# Patient Record
Sex: Female | Born: 1985 | Race: White | Hispanic: No | Marital: Married | Smoking: Current every day smoker
Health system: Southern US, Community
[De-identification: ages and names within clinical notes are randomized; demographics above are authoritative.]

## PROBLEM LIST (undated history)

## (undated) ENCOUNTER — Inpatient Hospital Stay: Payer: Self-pay

## (undated) DIAGNOSIS — R32 Unspecified urinary incontinence: Secondary | ICD-10-CM

## (undated) DIAGNOSIS — F329 Major depressive disorder, single episode, unspecified: Secondary | ICD-10-CM

## (undated) DIAGNOSIS — F909 Attention-deficit hyperactivity disorder, unspecified type: Secondary | ICD-10-CM

## (undated) DIAGNOSIS — G709 Myoneural disorder, unspecified: Secondary | ICD-10-CM

## (undated) DIAGNOSIS — B019 Varicella without complication: Secondary | ICD-10-CM

## (undated) DIAGNOSIS — R519 Headache, unspecified: Secondary | ICD-10-CM

## (undated) DIAGNOSIS — F319 Bipolar disorder, unspecified: Secondary | ICD-10-CM

## (undated) DIAGNOSIS — R51 Headache: Secondary | ICD-10-CM

## (undated) DIAGNOSIS — G43909 Migraine, unspecified, not intractable, without status migrainosus: Secondary | ICD-10-CM

## (undated) DIAGNOSIS — F419 Anxiety disorder, unspecified: Secondary | ICD-10-CM

## (undated) DIAGNOSIS — F32A Depression, unspecified: Secondary | ICD-10-CM

## (undated) HISTORY — DX: Attention-deficit hyperactivity disorder, unspecified type: F90.9

## (undated) HISTORY — DX: Varicella without complication: B01.9

## (undated) HISTORY — DX: Unspecified urinary incontinence: R32

## (undated) HISTORY — PX: GANGLION CYST EXCISION: SHX1691

## (undated) HISTORY — DX: Anxiety disorder, unspecified: F41.9

## (undated) HISTORY — PX: DILATION AND CURETTAGE OF UTERUS: SHX78

## (undated) HISTORY — PX: OVARIAN CYST REMOVAL: SHX89

## (undated) HISTORY — DX: Migraine, unspecified, not intractable, without status migrainosus: G43.909

## (undated) HISTORY — DX: Bipolar disorder, unspecified: F31.9

## (undated) HISTORY — DX: Myoneural disorder, unspecified: G70.9

---

## 2004-09-16 ENCOUNTER — Emergency Department: Payer: Self-pay | Admitting: Emergency Medicine

## 2004-10-15 ENCOUNTER — Emergency Department: Payer: Self-pay | Admitting: Emergency Medicine

## 2004-10-16 ENCOUNTER — Emergency Department: Payer: Self-pay | Admitting: Emergency Medicine

## 2004-11-03 ENCOUNTER — Emergency Department: Payer: Self-pay | Admitting: Emergency Medicine

## 2004-11-04 ENCOUNTER — Other Ambulatory Visit: Payer: Self-pay

## 2004-11-05 ENCOUNTER — Emergency Department: Payer: Self-pay | Admitting: Emergency Medicine

## 2004-11-06 ENCOUNTER — Emergency Department: Payer: Self-pay | Admitting: Emergency Medicine

## 2005-01-20 ENCOUNTER — Emergency Department: Payer: Self-pay | Admitting: Emergency Medicine

## 2005-03-09 ENCOUNTER — Emergency Department: Payer: Self-pay | Admitting: Emergency Medicine

## 2005-03-10 ENCOUNTER — Ambulatory Visit: Payer: Self-pay | Admitting: Emergency Medicine

## 2005-06-10 ENCOUNTER — Emergency Department: Payer: Self-pay | Admitting: Emergency Medicine

## 2005-06-15 ENCOUNTER — Emergency Department: Payer: Self-pay | Admitting: General Practice

## 2005-06-16 ENCOUNTER — Ambulatory Visit: Payer: Self-pay | Admitting: General Practice

## 2005-07-16 ENCOUNTER — Emergency Department: Payer: Self-pay | Admitting: Unknown Physician Specialty

## 2005-08-23 ENCOUNTER — Emergency Department: Payer: Self-pay | Admitting: Emergency Medicine

## 2005-09-17 ENCOUNTER — Emergency Department: Payer: Self-pay | Admitting: Unknown Physician Specialty

## 2005-10-24 ENCOUNTER — Emergency Department: Payer: Self-pay | Admitting: Emergency Medicine

## 2009-10-09 ENCOUNTER — Emergency Department: Payer: Self-pay | Admitting: Emergency Medicine

## 2009-10-12 ENCOUNTER — Emergency Department: Payer: Self-pay | Admitting: Emergency Medicine

## 2009-10-23 ENCOUNTER — Emergency Department: Payer: Self-pay | Admitting: Emergency Medicine

## 2009-11-01 ENCOUNTER — Emergency Department: Payer: Self-pay | Admitting: Emergency Medicine

## 2012-03-19 ENCOUNTER — Encounter: Payer: Self-pay | Admitting: Physical Medicine & Rehabilitation

## 2012-03-27 ENCOUNTER — Ambulatory Visit: Payer: Medicaid Other | Admitting: Physical Medicine & Rehabilitation

## 2012-04-13 ENCOUNTER — Ambulatory Visit: Payer: Medicaid Other | Admitting: Physical Medicine & Rehabilitation

## 2012-04-30 ENCOUNTER — Ambulatory Visit (HOSPITAL_BASED_OUTPATIENT_CLINIC_OR_DEPARTMENT_OTHER): Payer: Medicaid Other | Admitting: Physical Medicine & Rehabilitation

## 2012-04-30 ENCOUNTER — Encounter: Payer: Medicaid Other | Attending: Physical Medicine & Rehabilitation

## 2012-04-30 ENCOUNTER — Encounter: Payer: Self-pay | Admitting: Physical Medicine & Rehabilitation

## 2012-04-30 VITALS — BP 139/76 | HR 84 | Resp 16 | Ht 64.0 in | Wt 202.0 lb

## 2012-04-30 DIAGNOSIS — M549 Dorsalgia, unspecified: Secondary | ICD-10-CM

## 2012-04-30 DIAGNOSIS — G8929 Other chronic pain: Secondary | ICD-10-CM | POA: Insufficient documentation

## 2012-04-30 DIAGNOSIS — F17200 Nicotine dependence, unspecified, uncomplicated: Secondary | ICD-10-CM | POA: Insufficient documentation

## 2012-04-30 DIAGNOSIS — S335XXA Sprain of ligaments of lumbar spine, initial encounter: Secondary | ICD-10-CM

## 2012-04-30 DIAGNOSIS — M545 Low back pain, unspecified: Secondary | ICD-10-CM | POA: Insufficient documentation

## 2012-04-30 DIAGNOSIS — S39012A Strain of muscle, fascia and tendon of lower back, initial encounter: Secondary | ICD-10-CM | POA: Insufficient documentation

## 2012-04-30 MED ORDER — TRAMADOL HCL 50 MG PO TABS: 100.0000 mg | ORAL_TABLET | Freq: Three times a day (TID) | ORAL | Status: AC | PRN

## 2012-04-30 NOTE — Progress Notes (Signed)
Subjective:    Patient ID: Diane James, female    DOB: 1985/12/31, 26 y.o.   MRN: [id number]  HPI  26 year old female who gives a history of left-sided low back pain starting in 2009. She woke up from a D&C procedure and has had the pain since that time. She states she's had x-rays and MRIs but she does not have these with her today. She has been seen by [facility] but I do not have any notes. She has been seen by a pain clinic in [place] and I have reviewed their notes however no reference to any imaging studies. She has tried initially on tramadol 50 mg 3 times a day but then was advanced to oxycodone 5 mg twice a day then 5 mg 3 times a day. She did report a 45% pain relief with the oxycodone. Her pain is currently 7/10 in reviewing her notes from [place] she had a 7/10 pain while on the oxycodone. She is reporting that she has gone through physical therapy but this "" made her worse Her pain is worse morning and nighttime but during the day it's relatively better. Walking and bending as well as more sporting activities tend to make her pain worse. Her pain is improved with heat and the medication listed above. She states that one tramadol has not been physically helpful for her pain.  Past history positive for anxiety disorder as well as bipolar disorder,, a urine drug screen at previous pain clinic was positive for [facility]  But the patient states she was retested 2 weeks later and it was negative  Vocational history last employed in 2010.  Primary M.D. Is Dr. Mindi Curling  Social history single lives with her boyfriend smokes 1 pack per day of cigarettes denies any drug or alcohol abuse. Pain Inventory Average Pain 7 Pain Right Now 9 My pain is constant, sharp and stabbing  In the last 24 hours, has pain interfered with the following? General activity 6 Relation with others 8 Enjoyment of life 7 What TIME of day is your pain at its worst? morning and night Sleep (in general)  Poor  Pain is worse with: walking, bending and some activites Pain improves with: heat/ice and medication Relief from Meds: 5  Mobility walk without assistance ability to climb steps?  yes do you drive?  yes  Function not employed: date last employed 2010  Neuro/Psych anxiety  Prior Studies new patient  Physicians involved in your care Any changes since last visit?  no new patient eval    Review of Systems  Musculoskeletal: Positive for back pain.  Psychiatric/Behavioral: The patient is nervous/anxious.   All other systems reviewed and are negative.       Objective:   Physical Exam  Constitutional: She is oriented to person, place, and time. She appears well-developed and well-nourished.  HENT:  Head: Normocephalic and atraumatic.  Eyes: Conjunctivae are normal.  Neck: Normal range of motion.  Musculoskeletal: Normal range of motion.       Right hip: Normal.       Left hip: Normal.       Lumbar back: She exhibits tenderness and pain. She exhibits normal range of motion, no bony tenderness, no swelling, no deformity and no spasm.       Back:  Neurological: She is alert and oriented to person, place, and time. She has normal strength and normal reflexes. No sensory deficit. Coordination normal.  Reflex Scores:      Tricep reflexes are 2+ on  the right side and 2+ on the left side.      Bicep reflexes are 2+ on the right side and 2+ on the left side.      Brachioradialis reflexes are 2+ on the right side and 2+ on the left side.      Patellar reflexes are 2+ on the right side and 2+ on the left side.      Achilles reflexes are 2+ on the right side and 2+ on the left side. Skin: Skin is warm and dry.  Psychiatric: She has a normal mood and affect.          Assessment & Plan:  Chronic low back painbased on history and examination appears to be a lumbar strain. Given the chronicity of the problem I think it's reasonable to check some x-rays as well as send her  through some physical therapy. Her description of prior physical therapy does not sound like lumbar stabilization program was performed. I think this is worthwhile. As I discussed with the patient I currently do not see any indication for narcotic analgesics. Furthermore she had a urine drug screen that was reported positive for marijuana although she denies smoking. Taking this as a fact, her opioid risk total score would bump to 8 putting her at high-risk for opiate misuse. I will increase her tramadol 100 mg every 8 hours. If the patient is still complaining of severe pain when I reevaluated her we may need to get an MRI of the lumbar spine.

## 2012-04-30 NOTE — Patient Instructions (Signed)
Back Pain, Adult Low back pain is very common. About 1 in 5 people have back pain.The cause of low back pain is rarely dangerous. The pain often gets better over time.About half of people with a sudden onset of back pain feel better in just 2 weeks. About 8 in 10 people feel better by 6 weeks.  CAUSES Some common causes of back pain include:  Strain of the muscles or ligaments supporting the spine.   Wear and tear (degeneration) of the spinal discs.   Arthritis.   Direct injury to the back.  DIAGNOSIS Most of the time, the direct cause of low back pain is not known.However, back pain can be treated effectively even when the exact cause of the pain is unknown.Answering your caregiver's questions about your overall health and symptoms is one of the most accurate ways to make sure the cause of your pain is not dangerous. If your caregiver needs more information, he or she may order lab work or imaging tests (X-rays or MRIs).However, even if imaging tests show changes in your back, this usually does not require surgery. HOME CARE INSTRUCTIONS For many people, back pain returns.Since low back pain is rarely dangerous, it is often a condition that people can learn to manageon their own.   Remain active. It is stressful on the back to sit or stand in one place. Do not sit, drive, or stand in one place for more than 30 minutes at a time. Take short walks on level surfaces as soon as pain allows.Try to increase the length of time you walk each day.   Do not stay in bed.Resting more than 1 or 2 days can delay your recovery.   Do not avoid exercise or work.Your body is made to move.It is not dangerous to be active, even though your back may hurt.Your back will likely heal faster if you return to being active before your pain is gone.   Pay attention to your body when you bend and lift. Many people have less discomfortwhen lifting if they bend their knees, keep the load close to their  bodies,and avoid twisting. Often, the most comfortable positions are those that put less stress on your recovering back.   Find a comfortable position to sleep. Use a firm mattress and lie on your side with your knees slightly bent. If you lie on your back, put a pillow under your knees.   Only take over-the-counter or prescription medicines as directed by your caregiver. Over-the-counter medicines to reduce pain and inflammation are often the most helpful.Your caregiver may prescribe muscle relaxant drugs.These medicines help dull your pain so you can more quickly return to your normal activities and healthy exercise.   Put ice on the injured area.   Put ice in a plastic bag.   Place a towel between your skin and the bag.   Leave the ice on for 15 to 20 minutes, 3 to 4 times a day for the first 2 to 3 days. After that, ice and heat may be alternated to reduce pain and spasms.   Ask your caregiver about trying back exercises and gentle massage. This may be of some benefit.   Avoid feeling anxious or stressed.Stress increases muscle tension and can worsen back pain.It is important to recognize when you are anxious or stressed and learn ways to manage it.Exercise is a great option.  SEEK MEDICAL CARE IF:  You have pain that is not relieved with rest or medicine.   You have   pain that does not improve in 1 week.   You have new symptoms.   You are generally not feeling well.  SEEK IMMEDIATE MEDICAL CARE IF:   You have pain that radiates from your back into your legs.   You develop new bowel or bladder control problems.   You have unusual weakness or numbness in your arms or legs.   You develop nausea or vomiting.   You develop abdominal pain.   You feel faint.  Document Released: 11/28/2005 Document Revised: 11/17/2011 Document Reviewed: 04/18/2011 Exit[employer] Patient Information 2012 [employer]. 

## 2012-05-01 NOTE — Progress Notes (Signed)
Addended by: Caryl Ada on: 05/01/2012 11:20 AM   Modules accepted: Orders

## 2012-05-15 ENCOUNTER — Ambulatory Visit: Payer: Medicaid Other | Attending: Physical Medicine & Rehabilitation | Admitting: Physical Therapy

## 2012-05-28 ENCOUNTER — Ambulatory Visit: Payer: Medicaid Other | Admitting: Physical Medicine & Rehabilitation

## 2012-05-28 ENCOUNTER — Encounter: Payer: Medicaid Other | Attending: Physical Medicine & Rehabilitation

## 2012-05-28 DIAGNOSIS — G8929 Other chronic pain: Secondary | ICD-10-CM | POA: Insufficient documentation

## 2012-05-28 DIAGNOSIS — F17200 Nicotine dependence, unspecified, uncomplicated: Secondary | ICD-10-CM | POA: Insufficient documentation

## 2012-05-28 DIAGNOSIS — M545 Low back pain, unspecified: Secondary | ICD-10-CM | POA: Insufficient documentation

## 2013-03-14 DIAGNOSIS — F329 Major depressive disorder, single episode, unspecified: Secondary | ICD-10-CM | POA: Insufficient documentation

## 2013-03-14 DIAGNOSIS — F419 Anxiety disorder, unspecified: Secondary | ICD-10-CM | POA: Insufficient documentation

## 2013-03-14 DIAGNOSIS — F319 Bipolar disorder, unspecified: Secondary | ICD-10-CM | POA: Insufficient documentation

## 2013-07-12 ENCOUNTER — Emergency Department: Payer: Self-pay | Admitting: Emergency Medicine

## 2013-07-12 LAB — CBC: HCT: 34.9 % — ABNORMAL LOW (ref 35.0–47.0)

## 2013-07-12 LAB — URINALYSIS, COMPLETE
Ketone: NEGATIVE
Leukocyte Esterase: NEGATIVE
Nitrite: NEGATIVE
Protein: NEGATIVE
RBC,UR: 1 /HPF (ref 0–5)

## 2013-07-12 LAB — COMPREHENSIVE METABOLIC PANEL
Albumin: 2.9 g/dL — ABNORMAL LOW (ref 3.4–5.0)
Alkaline Phosphatase: 84 U/L (ref 50–136)
BUN: 6 mg/dL — ABNORMAL LOW (ref 7–18)
Co2: 23 mmol/L (ref 21–32)
Creatinine: 0.53 mg/dL — ABNORMAL LOW (ref 0.60–1.30)
EGFR (African American): >60
SGOT(AST): 23 U/L (ref 15–37)
SGPT (ALT): 23 U/L (ref 12–78)
Sodium: 138 mmol/L (ref 136–145)
Total Protein: 6.8 g/dL (ref 6.4–8.2)

## 2013-07-12 LAB — LIPASE, BLOOD: Lipase: 109 U/L (ref 73–393)

## 2013-11-01 ENCOUNTER — Emergency Department: Payer: Self-pay | Admitting: Emergency Medicine

## 2013-11-02 LAB — URINALYSIS, COMPLETE
Ketone: NEGATIVE
Nitrite: NEGATIVE
Ph: 5 (ref 4.5–8.0)
Protein: NEGATIVE
RBC,UR: 1 /HPF (ref 0–5)
Specific Gravity: 1.021 (ref 1.003–1.030)

## 2013-11-02 LAB — HCG, QUANTITATIVE, PREGNANCY: Beta Hcg, Quant.: 8253 m[IU]/mL — ABNORMAL HIGH

## 2013-11-14 DIAGNOSIS — R768 Other specified abnormal immunological findings in serum: Secondary | ICD-10-CM | POA: Insufficient documentation

## 2014-09-02 DIAGNOSIS — O0990 Supervision of high risk pregnancy, unspecified, unspecified trimester: Secondary | ICD-10-CM | POA: Insufficient documentation

## 2014-10-24 ENCOUNTER — Emergency Department: Payer: Self-pay | Admitting: Emergency Medicine

## 2014-10-24 LAB — COMPREHENSIVE METABOLIC PANEL
ALBUMIN: 3.8 g/dL (ref 3.4–5.0)
ALK PHOS: 88 U/L
AST: 24 U/L (ref 15–37)
Anion Gap: 7 (ref 7–16)
BUN: 10 mg/dL (ref 7–18)
Bilirubin,Total: 0.3 mg/dL (ref 0.2–1.0)
CALCIUM: 8.6 mg/dL (ref 8.5–10.1)
Chloride: 108 mmol/L — ABNORMAL HIGH (ref 98–107)
Co2: 24 mmol/L (ref 21–32)
Creatinine: 0.61 mg/dL (ref 0.60–1.30)
EGFR (Non-African Amer.): >60
Glucose: 92 mg/dL (ref 65–99)
Osmolality: 276 (ref 275–301)
POTASSIUM: 4 mmol/L (ref 3.5–5.1)
SGPT (ALT): 21 U/L
SODIUM: 139 mmol/L (ref 136–145)
TOTAL PROTEIN: 7.5 g/dL (ref 6.4–8.2)

## 2014-10-24 LAB — HCG, QUANTITATIVE, PREGNANCY: Beta Hcg, Quant.: 3752 m[IU]/mL — ABNORMAL HIGH

## 2014-10-24 LAB — HEPATIC FUNCTION PANEL A (ARMC): Bilirubin, Direct: <0.1 mg/dL (ref 0.0–0.2)

## 2015-04-06 LAB — SURGICAL PATHOLOGY

## 2015-06-24 LAB — OB RESULTS CONSOLE ABO/RH: RH TYPE: POSITIVE

## 2015-06-24 LAB — OB RESULTS CONSOLE RPR: RPR: NONREACTIVE

## 2015-06-24 LAB — OB RESULTS CONSOLE ANTIBODY SCREEN: ANTIBODY SCREEN: NEGATIVE

## 2015-06-24 LAB — OB RESULTS CONSOLE HIV ANTIBODY (ROUTINE TESTING): HIV: NONREACTIVE

## 2015-07-09 DIAGNOSIS — O99210 Obesity complicating pregnancy, unspecified trimester: Secondary | ICD-10-CM | POA: Insufficient documentation

## 2015-07-09 LAB — OB RESULTS CONSOLE HIV ANTIBODY (ROUTINE TESTING): HIV: NONREACTIVE

## 2015-07-09 LAB — OB RESULTS CONSOLE ABO/RH: RH TYPE: POSITIVE

## 2015-07-09 LAB — OB RESULTS CONSOLE HEPATITIS B SURFACE ANTIGEN: HEP B S AG: NEGATIVE

## 2015-07-09 LAB — OB RESULTS CONSOLE RUBELLA ANTIBODY, IGM: RUBELLA: IMMUNE

## 2015-07-09 LAB — OB RESULTS CONSOLE ANTIBODY SCREEN: ANTIBODY SCREEN: NEGATIVE

## 2015-07-09 LAB — OB RESULTS CONSOLE RPR: RPR: NONREACTIVE

## 2015-08-14 ENCOUNTER — Emergency Department
Admission: EM | Admit: 2015-08-14 | Discharge: 2015-08-14 | Disposition: A | Discharge status: Home / Self Care | Payer: No Typology Code available for payment source | Attending: Emergency Medicine | Admitting: Emergency Medicine

## 2015-08-14 ENCOUNTER — Encounter: Payer: Self-pay | Admitting: Emergency Medicine

## 2015-08-14 DIAGNOSIS — J069 Acute upper respiratory infection, unspecified: Secondary | ICD-10-CM | POA: Diagnosis not present

## 2015-08-14 DIAGNOSIS — B9789 Other viral agents as the cause of diseases classified elsewhere: Secondary | ICD-10-CM

## 2015-08-14 DIAGNOSIS — Z3A16 16 weeks gestation of pregnancy: Secondary | ICD-10-CM | POA: Diagnosis not present

## 2015-08-14 DIAGNOSIS — R062 Wheezing: Secondary | ICD-10-CM

## 2015-08-14 DIAGNOSIS — O99512 Diseases of the respiratory system complicating pregnancy, second trimester: Secondary | ICD-10-CM | POA: Diagnosis present

## 2015-08-14 DIAGNOSIS — Z87891 Personal history of nicotine dependence: Secondary | ICD-10-CM | POA: Insufficient documentation

## 2015-08-14 DIAGNOSIS — J988 Other specified respiratory disorders: Secondary | ICD-10-CM

## 2015-08-14 MED ORDER — ALBUTEROL SULFATE HFA 108 (90 BASE) MCG/ACT IN AERS: 2.0000 | INHALATION_SPRAY | Freq: Four times a day (QID) | RESPIRATORY_TRACT | Status: DC | PRN

## 2015-08-14 NOTE — ED Provider Notes (Signed)
CSN: [id number]     Arrival date & time 08/14/15  1744 History   First MD Initiated Contact with Patient 08/14/15 1758     Chief Complaint  Patient presents with  . Cough     (Consider location/radiation/quality/duration/timing/severity/associated sxs/prior Treatment) HPI  29 year old female presents to the emergency department for evaluation of cough, wheezing. She is [redacted] weeks pregnant, being seen at [facility]. She denies any abdominal pain, vaginal bleeding, discharge. Patient states for the last 3 days she's had a cough or runny nose congestion and wheezing. She has been taking Robitussin with minimal relief. She denies any fevers. She has had some sharp burning substernal chest pain with repetitive coughing. She is tolerating by mouth.  Past Medical History  Diagnosis Date  . Bipolar disorder   . Neuromuscular disorder    Past Surgical History  Procedure Laterality Date  . Ganglion cyst excision      left wrist x3  . Ovarian cyst removal      right ovary  . Dilation and curettage of uterus     Family History  Problem Relation Age of Onset  . Fibromyalgia Mother    Social History  Substance Use Topics  . Smoking status: Former Smoker -- 1.00 packs/day for 7 years    Types: Cigarettes  . Smokeless tobacco: Never Used  . Alcohol Use: None   OB History    Gravida Para Term Preterm AB TAB SAB Ectopic Multiple Living   1              Review of Systems  Constitutional: Negative for fever, chills, activity change and fatigue.  HENT: Positive for congestion. Negative for sinus pressure and sore throat.   Eyes: Negative for visual disturbance.  Respiratory: Positive for cough and wheezing. Negative for chest tightness and shortness of breath.   Cardiovascular: Negative for chest pain and leg swelling.  Gastrointestinal: Negative for nausea, vomiting, abdominal pain and diarrhea.  Genitourinary: Negative for dysuria.  Musculoskeletal: Negative for arthralgias and gait problem.   Skin: Negative for rash.  Neurological: Negative for weakness, numbness and headaches.  Hematological: Negative for adenopathy.  Psychiatric/Behavioral: Negative for behavioral problems, confusion and agitation.      Allergies  Silvadene and Morphine and related  Home Medications   Prior to Admission medications   Medication Sig Start Date End Date Taking? Authorizing Provider  albuterol (PROVENTIL HFA;VENTOLIN HFA) 108 (90 BASE) MCG/ACT inhaler Inhale 2 puffs into the lungs every 6 (six) hours as needed for wheezing or shortness of breath. 08/14/15   Evon Slack, PA-C  clonazePAM (KLONOPIN) 1 MG tablet Take 1 mg by mouth 3 (three) times daily as needed.    Historical Provider, MD  lithium carbonate 300 MG capsule Take 300 mg by mouth 3 (three) times daily with meals. Takes 2 q am and 3 q pm (  total)    Historical Provider, MD   BP 117/76 mmHg  Pulse 83  Temp(Src) 98.3 F (36.8 C) (Oral)  Resp 18  Ht  (1.626 m)  Wt 240 lb (108.863 kg)  BMI 41.18 kg/m2  SpO2 99% Physical Exam  Constitutional: She is oriented to person, place, and time. She appears well-developed and well-nourished. No distress.  HENT:  Head: Normocephalic and atraumatic.  Mouth/Throat: Oropharynx is clear and moist.  Moist mucous membranes  Eyes: EOM are normal. Pupils are equal, round, and reactive to light. Right eye exhibits no discharge. Left eye exhibits no discharge.  Neck: Normal range of  motion. Neck supple.  Cardiovascular: Normal rate, regular rhythm and intact distal pulses.   Pulmonary/Chest: Effort normal. No respiratory distress. She has wheezes (mild expiratory). She has no rales. She exhibits no tenderness.  Abdominal: Soft. She exhibits no distension. There is no tenderness. There is no guarding.  Appears [redacted] wks pregnant with normal fundal height.  Musculoskeletal: Normal range of motion. She exhibits no edema.  Lymphadenopathy:    She has cervical adenopathy (posterior  cervical).  Neurological: She is alert and oriented to person, place, and time. She has normal reflexes.  Skin: Skin is warm and dry.  Psychiatric: She has a normal mood and affect. Her behavior is normal. Thought content normal.    ED Course  Procedures (including critical care time) Labs Review Labs Reviewed - No data to display  Imaging Review No results found. I have personally reviewed and evaluated these images and lab results as part of my medical decision-making.   EKG Interpretation None      MDM   Final diagnoses:  Wheezing  Viral respiratory illness    29 year old female with 3 days of viral respiratory illness including cough, congestion, runny nose. Patient's vital signs are stable. Fetal heart tones within normal limits. Patient was given a prescription for albuterol to help with wheezing. She will return to the ER for any fevers or worsening symptoms urgent changes her health. She'll make sure that she is increasing her fluid intake and seemed hydrated.    Evon Slack, PA-C 08/14/15 1832  Emily Filbert, MD 08/14/15 2138

## 2015-08-14 NOTE — Discharge Instructions (Signed)
Cough, Adult ° A cough is a reflex that helps clear your throat and airways. It can help heal the body or may be a reaction to an irritated airway. A cough may only last 2 or 3 weeks (acute) or may last more than 8 weeks (chronic).  °CAUSES °Acute cough: °· Viral or bacterial infections. °Chronic cough: °· Infections. °· Allergies. °· Asthma. °· Post-nasal drip. °· Smoking. °· Heartburn or acid reflux. °· Some medicines. °· Chronic lung problems (COPD). °· Cancer. °SYMPTOMS  °· Cough. °· Fever. °· Chest pain. °· Increased breathing rate. °· High-pitched whistling sound when breathing (wheezing). °· Colored mucus that you cough up (sputum). °TREATMENT  °· A bacterial cough may be treated with antibiotic medicine. °· A viral cough must run its course and will not respond to antibiotics. °· Your caregiver may recommend other treatments if you have a chronic cough. °HOME CARE INSTRUCTIONS  °· Only take over-the-counter or prescription medicines for pain, discomfort, or fever as directed by your caregiver. Use cough suppressants only as directed by your caregiver. °· Use a cold steam vaporizer or humidifier in your bedroom or home to help loosen secretions. °· Sleep in a semi-upright position if your cough is worse at night. °· Rest as needed. °· Stop smoking if you smoke. °SEEK IMMEDIATE MEDICAL CARE IF:  °· You have pus in your sputum. °· Your cough starts to worsen. °· You cannot control your cough with suppressants and are losing sleep. °· You begin coughing up blood. °· You have difficulty breathing. °· You develop pain which is getting worse or is uncontrolled with medicine. °· You have a fever. °MAKE SURE YOU:  °· Understand these instructions. °· Will watch your condition. °· Will get help right away if you are not doing well or get worse. °Document Released: 05/27/2011 Document Revised: 02/20/2012 Document Reviewed: 05/27/2011 °ExitCare® Patient Information ©2015 [employer]. This information is not intended  to replace advice given to you by your health care provider. Make sure you discuss any questions you have with your health care provider. °Viral Infections °A viral infection can be caused by different types of viruses. Most viral infections are not serious and resolve on their own. However, some infections may cause severe symptoms and may lead to further complications. °SYMPTOMS °Viruses can frequently cause: °· Minor sore throat. °· Aches and pains. °· Headaches. °· Runny nose. °· Different types of rashes. °· Watery eyes. °· Tiredness. °· Cough. °· Loss of appetite. °· Gastrointestinal infections, resulting in nausea, vomiting, and diarrhea. °These symptoms do not respond to antibiotics because the infection is not caused by bacteria. However, you might catch a bacterial infection following the viral infection. This is sometimes called a "superinfection." Symptoms of such a bacterial infection may include: °· Worsening sore throat with pus and difficulty swallowing. °· Swollen neck glands. °· Chills and a high or persistent fever. °· Severe headache. °· Tenderness over the sinuses. °· Persistent overall ill feeling (malaise), muscle aches, and tiredness (fatigue). °· Persistent cough. °· Yellow, green, or brown mucus production with coughing. °HOME CARE INSTRUCTIONS  °· Only take over-the-counter or prescription medicines for pain, discomfort, diarrhea, or fever as directed by your caregiver. °· Drink enough water and fluids to keep your urine clear or pale yellow. Sports drinks can provide valuable electrolytes, sugars, and hydration. °· Get plenty of rest and maintain proper nutrition. Soups and broths with crackers or rice are fine. °SEEK IMMEDIATE MEDICAL CARE IF:  °· You have severe headaches,   shortness of breath, chest pain, neck pain, or an unusual rash. °· You have uncontrolled vomiting, diarrhea, or you are unable to keep down fluids. °· You or your child has an oral temperature above 102° F (38.9° C),  not controlled by medicine. °· Your baby is older than 3 months with a rectal temperature of 102° F (38.9° C) or higher. °· Your baby is 3 months old or younger with a rectal temperature of 100.4° F (38° C) or higher. °MAKE SURE YOU:  °· Understand these instructions. °· Will watch your condition. °· Will get help right away if you are not doing well or get worse. °Document Released: 09/07/2005 Document Revised: 02/20/2012 Document Reviewed: 04/04/2011 °ExitCare® Patient Information ©2015 [employer]. This information is not intended to replace advice given to you by your health care provider. Make sure you discuss any questions you have with your health care provider. ° °

## 2015-08-14 NOTE — ED Notes (Signed)
Fetal heart tones 142 mid left abd   Mternal HR   82

## 2015-08-14 NOTE — ED Notes (Signed)
Pt presents with cough for three days. Pt states cough was productive at first but now just feels "tight in her chest". Pt states has also had nasal congestion. Pt reports having a low-grade fever at home. Pt reports SOB. Pt is [redacted] weeks pregnant.

## 2015-08-14 NOTE — ED Notes (Signed)
Pt states she has been coughing since Wed, has a baby with bronchitis, states she is 16 weeks preg, appears in no distress.

## 2015-09-08 ENCOUNTER — Emergency Department: Payer: No Typology Code available for payment source

## 2015-09-08 ENCOUNTER — Encounter: Payer: Self-pay | Admitting: Emergency Medicine

## 2015-09-08 ENCOUNTER — Emergency Department
Admission: EM | Admit: 2015-09-08 | Discharge: 2015-09-08 | Disposition: A | Discharge status: Home / Self Care | Payer: No Typology Code available for payment source | Attending: Emergency Medicine | Admitting: Emergency Medicine

## 2015-09-08 DIAGNOSIS — Y990 Civilian activity done for income or pay: Secondary | ICD-10-CM | POA: Diagnosis not present

## 2015-09-08 DIAGNOSIS — W010XXA Fall on same level from slipping, tripping and stumbling without subsequent striking against object, initial encounter: Secondary | ICD-10-CM | POA: Diagnosis not present

## 2015-09-08 DIAGNOSIS — S59901A Unspecified injury of right elbow, initial encounter: Secondary | ICD-10-CM | POA: Insufficient documentation

## 2015-09-08 DIAGNOSIS — S3992XA Unspecified injury of lower back, initial encounter: Secondary | ICD-10-CM | POA: Diagnosis not present

## 2015-09-08 DIAGNOSIS — O9A212 Injury, poisoning and certain other consequences of external causes complicating pregnancy, second trimester: Secondary | ICD-10-CM | POA: Diagnosis present

## 2015-09-08 DIAGNOSIS — Z3A20 20 weeks gestation of pregnancy: Secondary | ICD-10-CM | POA: Diagnosis not present

## 2015-09-08 DIAGNOSIS — Y9289 Other specified places as the place of occurrence of the external cause: Secondary | ICD-10-CM | POA: Diagnosis not present

## 2015-09-08 DIAGNOSIS — Z87891 Personal history of nicotine dependence: Secondary | ICD-10-CM | POA: Insufficient documentation

## 2015-09-08 DIAGNOSIS — Y9389 Activity, other specified: Secondary | ICD-10-CM | POA: Insufficient documentation

## 2015-09-08 DIAGNOSIS — S79911A Unspecified injury of right hip, initial encounter: Secondary | ICD-10-CM | POA: Insufficient documentation

## 2015-09-08 DIAGNOSIS — Z3492 Encounter for supervision of normal pregnancy, unspecified, second trimester: Secondary | ICD-10-CM

## 2015-09-08 LAB — URINALYSIS COMPLETE WITH MICROSCOPIC (ARMC ONLY)
BILIRUBIN URINE: NEGATIVE
Bacteria, UA: NONE SEEN
GLUCOSE, UA: NEGATIVE mg/dL
Hgb urine dipstick: NEGATIVE
Leukocytes, UA: NEGATIVE
Nitrite: NEGATIVE
Protein, ur: NEGATIVE mg/dL
Specific Gravity, Urine: 1.013 (ref 1.005–1.030)
pH: 6 (ref 5.0–8.0)

## 2015-09-08 LAB — CBC WITH DIFFERENTIAL/PLATELET
Basophils Absolute: 0.1 10*3/uL (ref 0–0.1)
Basophils Relative: 1 %
EOS PCT: 1 %
Eosinophils Absolute: 0.1 10*3/uL (ref 0–0.7)
HCT: 35.3 % (ref 35.0–47.0)
Hemoglobin: 12.6 g/dL (ref 12.0–16.0)
LYMPHS ABS: 2.5 10*3/uL (ref 1.0–3.6)
LYMPHS PCT: 23 %
MCH: 33.8 pg (ref 26.0–34.0)
MCHC: 35.8 g/dL (ref 32.0–36.0)
MCV: 94.4 fL (ref 80.0–100.0)
MONOS PCT: 4 %
Monocytes Absolute: 0.5 10*3/uL (ref 0.2–0.9)
Neutro Abs: 7.6 10*3/uL — ABNORMAL HIGH (ref 1.4–6.5)
Neutrophils Relative %: 71 %
PLATELETS: 245 10*3/uL (ref 150–440)
RBC: 3.74 MIL/uL — AB (ref 3.80–5.20)
RDW: 13.5 % (ref 11.5–14.5)
WBC: 10.7 10*3/uL (ref 3.6–11.0)

## 2015-09-08 LAB — COMPREHENSIVE METABOLIC PANEL
ALK PHOS: 59 U/L (ref 38–126)
ALT: 11 U/L — AB (ref 14–54)
AST: 17 U/L (ref 15–41)
Albumin: 3.4 g/dL — ABNORMAL LOW (ref 3.5–5.0)
Anion gap: 6 (ref 5–15)
BUN: 7 mg/dL (ref 6–20)
CALCIUM: 8.9 mg/dL (ref 8.9–10.3)
CHLORIDE: 106 mmol/L (ref 101–111)
CO2: 22 mmol/L (ref 22–32)
CREATININE: 0.45 mg/dL (ref 0.44–1.00)
Glucose, Bld: 80 mg/dL (ref 65–99)
Potassium: 3.6 mmol/L (ref 3.5–5.1)
Sodium: 134 mmol/L — ABNORMAL LOW (ref 135–145)
Total Bilirubin: 0.5 mg/dL (ref 0.3–1.2)
Total Protein: 6.7 g/dL (ref 6.5–8.1)

## 2015-09-08 LAB — HCG, QUANTITATIVE, PREGNANCY: HCG, BETA CHAIN, QUANT, S: 8600 m[IU]/mL — AB (ref <5)

## 2015-09-08 MED ORDER — ACETAMINOPHEN 325 MG PO TABS
ORAL_TABLET | ORAL | Status: AC
  Administered 2015-09-08: 650 mg via ORAL
  Filled 2015-09-08: qty 2

## 2015-09-08 MED ORDER — CYCLOBENZAPRINE HCL 10 MG PO TABS: 10.0000 mg | ORAL_TABLET | Freq: Three times a day (TID) | ORAL | Status: DC | PRN

## 2015-09-08 MED ORDER — ACETAMINOPHEN 325 MG PO TABS
650.0000 mg | ORAL_TABLET | Freq: Once | ORAL | Status: AC
  Administered 2015-09-08: 650 mg via ORAL

## 2015-09-08 NOTE — ED Notes (Signed)
[id number] female presents to ED with c/o fall while at work approximately 1.5 hrs PTA. Pt reports that she was walking where someone had just mopped and fell down onto her right side. Pt reports pain to right elbow and right hip. Pt reports that she has been able to move and ambulate since falling. Pt states, "I don't think I hit my belly but I can't say for sure that I didn't." Pt is awake and alert during triage.

## 2015-09-08 NOTE — Discharge Instructions (Signed)
Second Trimester of Pregnancy The second trimester is from week 13 through week 28, months 4 through 6. The second trimester is often a time when you feel your best. Your body has also adjusted to being pregnant, and you begin to feel better physically. Usually, morning sickness has lessened or quit completely, you may have more energy, and you may have an increase in appetite. The second trimester is also a time when the fetus is growing rapidly. At the end of the sixth month, the fetus is about 9 inches long and weighs about 1 pounds. You will likely begin to feel the baby move (quickening) between 18 and 20 weeks of the pregnancy. BODY CHANGES Your body goes through many changes during pregnancy. The changes vary from woman to woman.   Your weight will continue to increase. You will notice your lower abdomen bulging out.  You may begin to get stretch marks on your hips, abdomen, and breasts.  You may develop headaches that can be relieved by medicines approved by your health care provider.  You may urinate more often because the fetus is pressing on your bladder.  You may develop or continue to have heartburn as a result of your pregnancy.  You may develop constipation because certain hormones are causing the muscles that push waste through your intestines to slow down.  You may develop hemorrhoids or swollen, bulging veins (varicose veins).  You may have back pain because of the weight gain and pregnancy hormones relaxing your joints between the bones in your pelvis and as a result of a shift in weight and the muscles that support your balance.  Your breasts will continue to grow and be tender.  Your gums may bleed and may be sensitive to brushing and flossing.  Dark spots or blotches (chloasma, mask of pregnancy) may develop on your face. This will likely fade after the baby is born.  A dark line from your belly button to the pubic area (linea nigra) may appear. This will likely fade  after the baby is born.  You may have changes in your hair. These can include thickening of your hair, rapid growth, and changes in texture. Some women also have hair loss during or after pregnancy, or hair that feels dry or thin. Your hair will most likely return to normal after your baby is born. WHAT TO EXPECT AT YOUR PRENATAL VISITS During a routine prenatal visit:  You will be weighed to make sure you and the fetus are growing normally.  Your blood pressure will be taken.  Your abdomen will be measured to track your baby's growth.  The fetal heartbeat will be listened to.  Any test results from the previous visit will be discussed. Your health care provider may ask you:  How you are feeling.  If you are feeling the baby move.  If you have had any abnormal symptoms, such as leaking fluid, bleeding, severe headaches, or abdominal cramping.  If you have any questions. Other tests that may be performed during your second trimester include:  Blood tests that check for:  Low iron levels (anemia).  Gestational diabetes (between 24 and 28 weeks).  Rh antibodies.  Urine tests to check for infections, diabetes, or protein in the urine.  An ultrasound to confirm the proper growth and development of the baby.  An amniocentesis to check for possible genetic problems.  Fetal screens for spina bifida and Down syndrome. HOME CARE INSTRUCTIONS   Avoid all smoking, herbs, alcohol, and unprescribed   drugs. These chemicals affect the formation and growth of the baby.  Follow your health care provider's instructions regarding medicine use. There are medicines that are either safe or unsafe to take during pregnancy.  Exercise only as directed by your health care provider. Experiencing uterine cramps is a good sign to stop exercising.  Continue to eat regular, healthy meals.  Wear a good support bra for breast tenderness.  Do not use hot tubs, steam rooms, or saunas.  Wear your  seat belt at all times when driving.  Avoid raw meat, uncooked cheese, cat litter boxes, and soil used by cats. These carry germs that can cause birth defects in the baby.  Take your prenatal vitamins.  Try taking a stool softener (if your health care provider approves) if you develop constipation. Eat more high-fiber foods, such as fresh vegetables or fruit and whole grains. Drink plenty of fluids to keep your urine clear or pale yellow.  Take warm sitz baths to soothe any pain or discomfort caused by hemorrhoids. Use hemorrhoid cream if your health care provider approves.  If you develop varicose veins, wear support hose. Elevate your feet for 15 minutes, 3-4 times a day. Limit salt in your diet.  Avoid heavy lifting, wear low heel shoes, and practice good posture.  Rest with your legs elevated if you have leg cramps or low back pain.  Visit your dentist if you have not gone yet during your pregnancy. Use a soft toothbrush to brush your teeth and be gentle when you floss.  A sexual relationship may be continued unless your health care provider directs you otherwise.  Continue to go to all your prenatal visits as directed by your health care provider. SEEK MEDICAL CARE IF:   You have dizziness.  You have mild pelvic cramps, pelvic pressure, or nagging pain in the abdominal area.  You have persistent nausea, vomiting, or diarrhea.  You have a bad smelling vaginal discharge.  You have pain with urination. SEEK IMMEDIATE MEDICAL CARE IF:   You have a fever.  You are leaking fluid from your vagina.  You have spotting or bleeding from your vagina.  You have severe abdominal cramping or pain.  You have rapid weight gain or loss.  You have shortness of breath with chest pain.  You notice sudden or extreme swelling of your face, hands, ankles, feet, or legs.  You have not felt your baby move in over an hour.  You have severe headaches that do not go away with  medicine.  You have vision changes. Document Released: 11/22/2001 Document Revised: 12/03/2013 Document Reviewed: 01/29/2013 Exit[employer] Patient Information 2015 [employer]. This information is not intended to replace advice given to you by your health care provider. Make sure you discuss any questions you have with your health care provider.  

## 2015-09-08 NOTE — ED Notes (Signed)
Pt reports slipping and falling at work today. Pt denies LOC, hitting her head or neck pain. Pt reports right sided pain, right elbow pain and right hip pain. Pt ambulates without assistance.

## 2015-09-08 NOTE — ED Provider Notes (Signed)
[facility] Emergency Department Provider Note     Time seen: ----------------------------------------- 1:03 PM on 09/08/2015 -----------------------------------------    I have reviewed the triage vital signs and the nursing notes.   HISTORY  Chief Complaint Fall    HPI Diane James is a 29 y.o. female who presents ER for slipping and falling at work today. Patient states she landed on her right side, she is currently [redacted] weeks pregnant. Complaining of right side pain and right elbow pain and right hip pain. She stable and really without assistance. Fetal heart tones here in triage were normal. Not had any vaginal bleeding or leakage of fluid.   Past Medical History  Diagnosis Date  . Bipolar disorder   . Neuromuscular disorder     Patient Active Problem List   Diagnosis Date Noted  . Lumbago 04/30/2012  . Lumbar strain 04/30/2012    Past Surgical History  Procedure Laterality Date  . Ganglion cyst excision      left wrist x3  . Ovarian cyst removal      right ovary  . Dilation and curettage of uterus      Allergies Silvadene and Morphine and related  Social History Social History  Substance Use Topics  . Smoking status: Former Smoker -- 1.00 packs/day for 7 years    Types: Cigarettes  . Smokeless tobacco: Never Used  . Alcohol Use: No    Review of Systems Constitutional: Negative for fever. Eyes: Negative for visual changes. ENT: Negative for sore throat. Cardiovascular: Negative for chest pain. Respiratory: Negative for shortness of breath. Gastrointestinal: Negative for abdominal pain, vomiting and diarrhea. Genitourinary: Negative for dysuria. Musculoskeletal: Negative for back pain. Skin: Negative for rash. Neurological: Negative for headaches, focal weakness or numbness.  10-point ROS otherwise negative.  ____________________________________________   PHYSICAL EXAM:  VITAL SIGNS: ED Triage Vitals  Enc  Vitals Group     BP 09/08/15 1133 122/73 mmHg     Pulse Rate 09/08/15 1133 93     Resp 09/08/15 1133 18     Temp 09/08/15 1133 99.5 F (37.5 C)     Temp Source 09/08/15 1133 Oral     SpO2 09/08/15 1133 97 %     Weight 09/08/15 1133 245 lb (111.131 kg)     Height 09/08/15 1133  (1.626 m)     Head Cir --      Peak Flow --      Pain Score 09/08/15 1134 9     Pain Loc --      Pain Edu? --      Excl. in GC? --     Constitutional: Alert and oriented. Well appearing and in no distress. Eyes: Conjunctivae are normal. PERRL. Normal extraocular movements. ENT   Head: Normocephalic and atraumatic.   Nose: No congestion/rhinnorhea.   Mouth/Throat: Mucous membranes are moist.   Neck: No stridor. Cardiovascular: Normal rate, regular rhythm. Normal and symmetric distal pulses are present in all extremities. No murmurs, rubs, or gallops. Respiratory: Normal respiratory effort without tachypnea nor retractions. Breath sounds are clear and equal bilaterally. No wheezes/rales/rhonchi. Gastrointestinal: Soft and nontender. No distention. No abdominal bruits.  Musculoskeletal: Mild pain with range of motion of the low back and right hip. Neurologic:  Normal speech and language. No gross focal neurologic deficits are appreciated. Speech is normal. No gait instability. Skin:  Skin is warm, dry and intact. No rash noted. Psychiatric: Mood and affect are normal. Speech and behavior are normal. Patient exhibits  appropriate insight and judgment. ____________________________________________  ED COURSE:  Pertinent labs & imaging results that were available during my care of the patient were reviewed by me and considered in my medical decision making (see chart for details). We'll obtain labs and ultrasound. ____________________________________________    LABS (pertinent positives/negatives)  Labs Reviewed  CBC WITH DIFFERENTIAL/PLATELET - Abnormal; Notable for the following:    RBC  3.74 (*)    Neutro Abs 7.6 (*)    All other components within normal limits  COMPREHENSIVE METABOLIC PANEL - Abnormal; Notable for the following:    Sodium 134 (*)    Albumin 3.4 (*)    ALT 11 (*)    All other components within normal limits  URINALYSIS COMPLETEWITH MICROSCOPIC ([facility] ONLY) - Abnormal; Notable for the following:    Color, Urine YELLOW (*)    APPearance HAZY (*)    Ketones, ur 1+ (*)    Squamous Epithelial / LPF 0-5 (*)    All other components within normal limits  HCG, QUANTITATIVE, PREGNANCY  TYPE AND SCREEN    RADIOLOGY   Ultrasound OB limited IMPRESSION: 1. Single live intrauterine pregnancy with best estimated gestational age of [redacted] weeks 1 day. This corresponds with the expected gestational age based on LMP. 2. No acute fetal, placental or adnexal abnormalities identified.  This exam is performed on an emergent basis and does not comprehensively evaluate fetal size, dating, or anatomy; follow-up complete OB US should be considered if further fetal assessment is warranted.  ____________________________________________  FINAL ASSESSMENT AND PLAN  Fall, [redacted] weeks pregnant  Plan: Patient with labs and imaging as dictated above. Labs and ultrasound are unremarkable this time. Previous to date has her at just short of 20 weeks. She will have close follow-up with [facility] for reevaluation.   Emily Filbert, MD   Emily Filbert, MD 09/08/15 [id number]

## 2015-09-08 NOTE — ED Notes (Signed)
Fetal heart tones assessed. Fetal HR 150 to LLQ with pt HR 84.

## 2015-09-17 DIAGNOSIS — O99330 Smoking (tobacco) complicating pregnancy, unspecified trimester: Secondary | ICD-10-CM | POA: Insufficient documentation

## 2015-12-07 ENCOUNTER — Inpatient Hospital Stay
Admission: EM | Admit: 2015-12-07 | Discharge: 2015-12-08 | Disposition: A | Discharge status: Home / Self Care | Payer: Medicaid Other | Attending: Obstetrics and Gynecology | Admitting: Obstetrics and Gynecology

## 2015-12-07 DIAGNOSIS — O26893 Other specified pregnancy related conditions, third trimester: Secondary | ICD-10-CM | POA: Diagnosis not present

## 2015-12-07 DIAGNOSIS — O368130 Decreased fetal movements, third trimester, not applicable or unspecified: Secondary | ICD-10-CM | POA: Diagnosis present

## 2015-12-07 DIAGNOSIS — R51 Headache: Secondary | ICD-10-CM | POA: Diagnosis present

## 2015-12-07 DIAGNOSIS — Z3A35 35 weeks gestation of pregnancy: Secondary | ICD-10-CM | POA: Diagnosis not present

## 2015-12-07 HISTORY — DX: Headache: R51

## 2015-12-07 HISTORY — DX: Headache, unspecified: R51.9

## 2015-12-07 HISTORY — DX: Depression, unspecified: F32.A

## 2015-12-07 HISTORY — DX: Major depressive disorder, single episode, unspecified: F32.9

## 2015-12-07 LAB — URINALYSIS COMPLETE WITH MICROSCOPIC (ARMC ONLY)
BILIRUBIN URINE: NEGATIVE
GLUCOSE, UA: NEGATIVE mg/dL
Hgb urine dipstick: NEGATIVE
Ketones, ur: NEGATIVE mg/dL
Leukocytes, UA: NEGATIVE
Nitrite: NEGATIVE
Protein, ur: NEGATIVE mg/dL
Specific Gravity, Urine: 1.004 — ABNORMAL LOW (ref 1.005–1.030)
pH: 7 (ref 5.0–8.0)

## 2015-12-07 LAB — CBC WITH DIFFERENTIAL/PLATELET
BASOS PCT: 0 %
Basophils Absolute: 0 10*3/uL (ref 0–0.1)
EOS ABS: 0.1 10*3/uL (ref 0–0.7)
Eosinophils Relative: 1 %
HCT: 34.7 % — ABNORMAL LOW (ref 35.0–47.0)
HEMOGLOBIN: 12.1 g/dL (ref 12.0–16.0)
Lymphocytes Relative: 26 %
Lymphs Abs: 2.8 10*3/uL (ref 1.0–3.6)
MCH: 33 pg (ref 26.0–34.0)
MCHC: 34.8 g/dL (ref 32.0–36.0)
MCV: 94.8 fL (ref 80.0–100.0)
MONOS PCT: 6 %
Monocytes Absolute: 0.6 10*3/uL (ref 0.2–0.9)
NEUTROS PCT: 67 %
Neutro Abs: 7 10*3/uL — ABNORMAL HIGH (ref 1.4–6.5)
Platelets: 207 10*3/uL (ref 150–440)
RBC: 3.66 MIL/uL — AB (ref 3.80–5.20)
RDW: 13.3 % (ref 11.5–14.5)
WBC: 10.4 10*3/uL (ref 3.6–11.0)

## 2015-12-07 LAB — COMPREHENSIVE METABOLIC PANEL
ALBUMIN: 3.2 g/dL — AB (ref 3.5–5.0)
ALK PHOS: 69 U/L (ref 38–126)
ALT: 11 U/L — ABNORMAL LOW (ref 14–54)
ANION GAP: 7 (ref 5–15)
AST: 18 U/L (ref 15–41)
BUN: 7 mg/dL (ref 6–20)
CALCIUM: 9 mg/dL (ref 8.9–10.3)
CO2: 23 mmol/L (ref 22–32)
Chloride: 107 mmol/L (ref 101–111)
Creatinine, Ser: 0.36 mg/dL — ABNORMAL LOW (ref 0.44–1.00)
GFR calc Af Amer: >60 mL/min (ref >60)
GFR calc non Af Amer: >60 mL/min (ref >60)
GLUCOSE: 85 mg/dL (ref 65–99)
Potassium: 3.7 mmol/L (ref 3.5–5.1)
SODIUM: 137 mmol/L (ref 135–145)
Total Bilirubin: 0.5 mg/dL (ref 0.3–1.2)
Total Protein: 6.4 g/dL — ABNORMAL LOW (ref 6.5–8.1)

## 2015-12-07 LAB — LACTATE DEHYDROGENASE: LDH: 119 U/L (ref 98–192)

## 2015-12-07 LAB — URIC ACID: Uric Acid, Serum: 3.4 mg/dL (ref 2.3–6.6)

## 2015-12-07 MED ORDER — BUTALBITAL-APAP-CAFFEINE 50-325-40 MG PO TABS
1.0000 | ORAL_TABLET | Freq: Once | ORAL | Status: AC
  Administered 2015-12-07: 1 via ORAL
  Filled 2015-12-07: qty 1

## 2015-12-07 NOTE — OB Triage Provider Note (Signed)
History     CSN: [id number]  Arrival date and time: 12/07/15 2119   None     Chief Complaint  Patient presents with  . Hypertension  . Decreased Fetal Movement  . Dizziness   Hypertension Associated symptoms include chest pain and headaches. Pertinent negatives include no blurred vision, palpitations or shortness of breath.  Dizziness Associated symptoms include chest pain and headaches. Pertinent negatives include no abdominal pain, chills, fever, nausea, rash or vomiting.  Diane James is a [id number] at 32+5 weeks today by LMP of 04/23/15 with an EDD of 01/28/16 presenting today with multiple complaints: elevated blood pressure checked by her home monitor with readings of 164/101, decreased fetal movement, she also c/o of increased swelling in her hands and face, dizziness with standing, migraine headaches without relief from Tylenol, and numbness and tingling in her hands bilaterally.  She reports she has a history of migraine headaches, anxiety and depression in which she does not take medication, and a fetal demise at 34 weeks for abruption.  She sees [facility] for her prenatal care.  She is currently only taking an ASA and prenatal vitamin.   OB History    Gravida Para Term Preterm AB TAB SAB Ectopic Multiple Living   6 3 2 1 2  2   2       Obstetric Comments   3rd pregnancy was stillborn      Past Medical History  Diagnosis Date  . Bipolar disorder ([facility])   . Neuromuscular disorder ([facility])   . Headache   . Depression     Past Surgical History  Procedure Laterality Date  . Ganglion cyst excision      left wrist x3  . Ovarian cyst removal      right ovary  . Dilation and curettage of uterus      Family History  Problem Relation Age of Onset  . Fibromyalgia Mother     Social History  Substance Use Topics  . Smoking status: Former Smoker -- 1.00 packs/day for 7 years    Types: Cigarettes  . Smokeless tobacco: Never Used  . Alcohol Use: No    Allergies:   Allergies  Allergen Reactions  . Silvadene [Silver Sulfadiazine] Hives  . Morphine And Related     Prescriptions prior to admission  Medication Sig Dispense Refill Last Dose  . aspirin 81 MG tablet Take 81 mg by mouth daily.     . cyclobenzaprine (FLEXERIL) 10 MG tablet Take 1 tablet (10 mg total) by mouth every 8 (eight) hours as needed for muscle spasms. 30 tablet 1   . Prenatal Vit-Fe Fumarate-FA (PRENATAL MULTIVITAMIN) TABS tablet Take 1 tablet by mouth daily at 12 noon.       Review of Systems  Constitutional: Negative for fever and chills.  HENT:       Migraine headache without relief of Tylenol  Eyes: Negative for blurred vision, double vision and photophobia.  Respiratory: Negative for shortness of breath and wheezing.   Cardiovascular: Positive for chest pain. Negative for palpitations.  Gastrointestinal: Negative for heartburn, nausea, vomiting and abdominal pain.  Genitourinary: Negative for dysuria.  Musculoskeletal:       +numbness and tingling in hands bilaterally   Skin: Negative for itching and rash.  Neurological: Positive for dizziness and headaches.  Psychiatric/Behavioral: The patient is nervous/anxious.   Good fetal movement now Denies LOF, VB, abnormal discharge  Physical Exam   Blood pressure 122/73, pulse 81, temperature 98.9 F (37.2 C), temperature source  Oral, resp. rate 18, height 5' 4"  (1.626 m), weight 115.214 kg (254 lb), last menstrual period 04/13/2015.  Physical Exam  Constitutional: She is oriented to person, place, and time. She appears well-developed and well-nourished.  HENT:  Head: Normocephalic.  Eyes: Pupils are equal, round, and reactive to light.  Neck: Normal range of motion.  Cardiovascular: Normal rate and regular rhythm.   Respiratory: Effort normal and breath sounds normal.  GI: Soft. Bowel sounds are normal.  obese  Genitourinary:  +gravid uterus, no contractions palpated, non-tender  Musculoskeletal:  Slight pitting  edema in LE feet bilaterally   Neurological: She is alert and oriented to person, place, and time. She displays normal reflexes.  Skin: Skin is warm and dry.   Fetal Monitoring: Baseline: 125 bpm / moderate variability /+accels / no decels TOCO: uterus is quiet  Results for Diane, James (MRN [id number]) as of 12/08/2015 00:13  Ref. Range 12/07/2015 22:51  Sodium Latest Ref Range: 135-145 mmol/L 137  Potassium Latest Ref Range: 3.5-5.1 mmol/L 3.7  Chloride Latest Ref Range: 101-111 mmol/L 107  CO2 Latest Ref Range: 22-32 mmol/L 23  BUN Latest Ref Range: 6-20 mg/dL 7  Creatinine Latest Ref Range: 0.44-1.00 mg/dL 0.36 (L)  Calcium Latest Ref Range: 8.9-10.3 mg/dL 9.0  EGFR (Non-African Amer.) Latest Ref Range: >60 mL/min >60  EGFR (African American) Latest Ref Range: >60 mL/min >60  Glucose Latest Ref Range: 65-99 mg/dL 85  Anion gap Latest Ref Range: 5-15  7  Alkaline Phosphatase Latest Ref Range: 38-126 U/L 69  Albumin Latest Ref Range: 3.5-5.0 g/dL 3.2 (L)  Uric Acid, Serum Latest Ref Range: 2.3-6.6 mg/dL 3.4  AST Latest Ref Range: 15-41 U/L 18  ALT Latest Ref Range: 14-54 U/L 11 (L)  Total Protein Latest Ref Range: 6.5-8.1 g/dL 6.4 (L)  Total Bilirubin Latest Ref Range: 0.3-1.2 mg/dL 0.5  LDH Latest Ref Range: 98-192 U/L 119  WBC Latest Ref Range: 3.6-11.0 K/uL 10.4  RBC Latest Ref Range: 3.80-5.20 MIL/uL 3.66 (L)  Hemoglobin Latest Ref Range: 12.0-16.0 g/dL 12.1  HCT Latest Ref Range: 35.0-47.0 % 34.7 (L)  MCV Latest Ref Range: 80.0-100.0 fL 94.8  MCH Latest Ref Range: 26.0-34.0 pg 33.0  MCHC Latest Ref Range: 32.0-36.0 g/dL 34.8  RDW Latest Ref Range: 11.5-14.5 % 13.3  Platelets Latest Ref Range: 150-440 K/uL 207  Neutrophils Latest Units: % 67  Lymphocytes Latest Units: % 26  Monocytes Relative Latest Units: % 6  Eosinophil Latest Units: % 1  Basophil Latest Units: % 0  NEUT# Latest Ref Range: 1.4-6.5 K/uL 7.0 (H)  Lymphocyte # Latest Ref Range: 1.0-3.6 K/uL  2.8  Monocyte # Latest Ref Range: 0.2-0.9 K/uL 0.6  Eosinophils Absolute Latest Ref Range: 0-0.7 K/uL 0.1  Basophils Absolute Latest Ref Range: 0-0.1 K/uL 0.0   Results for Diane, James (MRN [id number]) as of 12/08/2015 00:13  Ref. Range 12/07/2015 22:42  Appearance Latest Ref Range: CLEAR  CLEAR (A)  Bacteria, UA Latest Ref Range: NONE SEEN  RARE (A)  Bilirubin Urine Latest Ref Range: NEGATIVE  NEGATIVE  Color, Urine Latest Ref Range: YELLOW  STRAW (A)  Glucose Latest Ref Range: NEGATIVE mg/dL NEGATIVE  Hgb urine dipstick Latest Ref Range: NEGATIVE  NEGATIVE  Hyaline Casts, UA Unknown PRESENT  Ketones, ur Latest Ref Range: NEGATIVE mg/dL NEGATIVE  Leukocytes, UA Latest Ref Range: NEGATIVE  NEGATIVE  Nitrite Latest Ref Range: NEGATIVE  NEGATIVE  pH Latest Ref Range: 5.0-8.0  7.0  Protein Latest Ref Range: NEGATIVE mg/dL  NEGATIVE  RBC / HPF Latest Ref Range: 0-5 RBC/hpf 0-5  Specific Gravity, Urine Latest Ref Range: 1.005-1.030  1.004 (L)  Squamous Epithelial / LPF Latest Ref Range: NONE SEEN  0-5 (A)  WBC, UA Latest Ref Range: 0-5 WBC/hpf 0-5  Total Protein, Urine Latest Units: mg/dL <6  Protein Creatinine Ratio Latest Ref Range: 0.00-0.15 mg/mgCre Pend  RESULT BELOW REPORTABLE RANGE,  UNABLE TO CALCULATE.  Creatinine, Urine Latest Units: mg/dL 25     Procedures    Assessment and Plan  IUP at 34 weeks Category 1 fetal tracing Muddy labs drawn Serial blood pressures Fioricet x 1 for headache   Darliss Cheney 12/07/2015, 11:36 PM   Update at 0030: Headache is completely gone with 1 Fioricet - pt. Reports she does not feel tired and actually has "a boost of energy" and feels safe to drive 30 minutes home - offered patient to stay at hospital and rest and she declines and states she will call L&D when she gets home safely PIH labs WNL BP's stable and WNL Category 1 fetal tracing Carpal Tunnel - recommend having her OB order wrist/hand splints  FKC's  daily Preterm labor precautions and warning s/s reviewed  D/C home  F/U at [facility] this morning  12/27 at [facility], CNM

## 2015-12-07 NOTE — OB Triage Note (Signed)
C/o Headache, dizziness, decreased fetal movement, increased BP, hands and lips numbness

## 2015-12-07 NOTE — Progress Notes (Signed)
Pt states she has taken Tylenol 2 tabs around 1800.

## 2015-12-08 LAB — PROTEIN / CREATININE RATIO, URINE: Creatinine, Urine: 25 mg/dL

## 2015-12-08 NOTE — Progress Notes (Signed)
Pt states she has relief of headache following the medication.  States she is able to drive.  She was also given d/c instructions and verbalized understanding.  She then went ambulatory home in stable condition.

## 2016-03-23 ENCOUNTER — Ambulatory Visit: Payer: Self-pay | Admitting: Family Medicine

## 2016-03-30 ENCOUNTER — Ambulatory Visit (INDEPENDENT_AMBULATORY_CARE_PROVIDER_SITE_OTHER): Payer: BLUE CROSS/BLUE SHIELD | Admitting: Family Medicine

## 2016-03-30 ENCOUNTER — Encounter: Payer: Self-pay | Admitting: Family Medicine

## 2016-03-30 ENCOUNTER — Other Ambulatory Visit: Payer: Self-pay

## 2016-03-30 VITALS — BP 128/78 | HR 80 | Temp 98.1°F | Resp 16 | Ht 64.0 in | Wt 245.0 lb

## 2016-03-30 DIAGNOSIS — F319 Bipolar disorder, unspecified: Secondary | ICD-10-CM | POA: Diagnosis not present

## 2016-03-30 DIAGNOSIS — F909 Attention-deficit hyperactivity disorder, unspecified type: Secondary | ICD-10-CM

## 2016-03-30 DIAGNOSIS — F988 Other specified behavioral and emotional disorders with onset usually occurring in childhood and adolescence: Secondary | ICD-10-CM | POA: Insufficient documentation

## 2016-03-30 DIAGNOSIS — J309 Allergic rhinitis, unspecified: Secondary | ICD-10-CM | POA: Insufficient documentation

## 2016-03-30 DIAGNOSIS — R32 Unspecified urinary incontinence: Secondary | ICD-10-CM | POA: Diagnosis not present

## 2016-03-30 MED ORDER — DESMOPRESSIN ACETATE 0.2 MG PO TABS: 600.0000 ug | ORAL_TABLET | Freq: Every day | ORAL | Status: DC

## 2016-03-30 MED ORDER — BUPROPION HCL ER (XL) 300 MG PO TB24: 300.0000 mg | ORAL_TABLET | Freq: Every day | ORAL | Status: DC

## 2016-03-30 MED ORDER — ARIPIPRAZOLE 15 MG PO TABS: 15.0000 mg | ORAL_TABLET | Freq: Every day | ORAL | Status: DC

## 2016-03-30 NOTE — Progress Notes (Signed)
Date:  03/30/2016   Name:  Diane James   DOB:  May 08, 1986   MRN:  [id number]  PCP:  [facility]    Chief Complaint: Establish Care   History of Present Illness:  This is a 30 y.o. female to establish care. Nine wks postpartum (pregnancy uncomplicated), also has 31 y/o son with febrile seizures and 139 y/o son with ADHD. Has IUD now and not breastfeeding. Hx bipolar 1 d/o, hospitalized age 30 and [age], on Depakote, lithium, Risperdal, Prozac, Zoloft in past without benefit and several made gain weight. Abilify helped but couldn't afford at time. Last saw psych during last psych hospitalization. On Wellbutrin before pregnancy, stopped when found out pregnant, restarted 2d ago, wonders if longer acting form. On Klonopin in past for anxiety, still has some but not taking currently, makes feel blah and interferes with childcare. On DDAVP for enuresis for 15 yrs, urology w/u negative. Father with bipolar d/o, mother with fibromyalgia. Imms UTD, no longer smoking.  Review of Systems:  Review of Systems  Constitutional: Negative for fever and fatigue.  Respiratory: Negative for cough and shortness of breath.   Cardiovascular: Negative for chest pain and leg swelling.  Endocrine: Negative for polyuria.  Genitourinary: Negative for difficulty urinating.  Neurological: Negative for syncope and light-headedness.    Patient Active Problem List   Diagnosis Date Noted  . Obesity, Class III, BMI 40-49.9 (morbid obesity) (HCC) 03/30/2016  . Enuresis 03/30/2016  . Allergic rhinitis 03/30/2016  . ADD (attention deficit disorder) 03/30/2016  . Obesity affecting pregnancy, antepartum 07/09/2015  . High-risk pregnancy 09/02/2014  . Other specified abnormal immunological findings in serum 11/14/2013  . Anxiety and depression 03/14/2013  . Bipolar I disorder (HCC) 03/14/2013  . Lumbar strain 04/30/2012    Prior to Admission medications   Medication Sig Start Date End Date Taking?  Authorizing Provider  acetaminophen (TYLENOL) 325 MG tablet Take by mouth.   Yes Historical Provider, MD  desmopressin (DDAVP) 0.2 MG tablet Take 3 tablets (600 mcg total) by mouth at bedtime. 03/30/16  Yes Schuyler AmorWilliam Mekai Wilkinson, MD  loratadine (SM LORATADINE) 5 MG/5ML syrup Take by mouth.   Yes Historical Provider, MD  ARIPiprazole (ABILIFY) 15 MG tablet Take 1 tablet (15 mg total) by mouth daily. 03/30/16   Schuyler AmorWilliam Deontay Ladnier, MD  buPROPion (WELLBUTRIN XL) 300 MG 24 hr tablet Take 1 tablet (300 mg total) by mouth daily. 03/30/16   Schuyler AmorWilliam Ardys Hataway, MD  clonazePAM Scarlette Calico(KLONOPIN) 0.5 MG tablet Reported on 03/30/2016 01/27/16   Historical Provider, MD    Allergies  Allergen Reactions  . Morphine Hives  . Silvadene [Silver Sulfadiazine] Hives    burning  . Morphine And Related     Past Surgical History  Procedure Laterality Date  . Ganglion cyst excision      left wrist x3  . Ovarian cyst removal      right ovary  . Dilation and curettage of uterus      Social History  Substance Use Topics  . Smoking status: Former Smoker -- 1.00 packs/day for 7 years    Types: Cigarettes  . Smokeless tobacco: Never Used  . Alcohol Use: No    Family History  Problem Relation Age of Onset  . Fibromyalgia Mother   . Cancer Maternal Grandmother     breast cancer    Medication list has been reviewed and updated.  Physical Examination: BP 128/78 mmHg  Pulse 80  Temp(Src) 98.1 F (36.7 C) (Oral)  Resp 16  Ht  (1.626 m)  Wt 245 lb (111.131 kg)  BMI 42.03 kg/m2  LMP 03/30/2016  Breastfeeding? Unknown  Physical Exam  Constitutional: She is oriented to person, place, and time. She appears well-developed and well-nourished.  HENT:  Right Ear: External ear normal.  Left Ear: External ear normal.  Nose: Nose normal.  Mouth/Throat: Oropharynx is clear and moist.  TM's clear  Eyes: Conjunctivae and EOM are normal. Pupils are equal, round, and reactive to light.  Neck: Neck supple. No thyromegaly present.   Cardiovascular: Normal rate, regular rhythm and normal heart sounds.   Pulmonary/Chest: Effort normal and breath sounds normal.  Abdominal: Soft. She exhibits no distension. There is no tenderness.  Musculoskeletal: She exhibits no edema.  Lymphadenopathy:    She has no cervical adenopathy.  Neurological: She is alert and oriented to person, place, and time.  Skin: Skin is warm and dry.  Psychiatric: She has a normal mood and affect. Her behavior is normal.  Nursing note and vitals reviewed.   Assessment and Plan:  1. Bipolar I disorder (HCC) Change Wellbutrin to XR 300 mg daily and restart Abilify 15 mg daily, may use Klonopin prn anxiety - Ambulatory referral to Psychiatry  2. ADD (attention deficit disorder) No rx at present, psych to follow  3. Enuresis Refill DDAVP, urology w/u negative in past  4. Allergic rhinitis, unspecified allergic rhinitis type Well controlled on prn loratidine  5. Obesity, Class III, BMI 40-49.9 (morbid obesity) (HCC) Relates to psych meds, difficult to exercise with childcare responsibilities  Return in about 4 weeks (around 04/27/2016).  Dionne Ano. Kingsley Spittle MD [facility]  03/30/2016

## 2016-04-11 ENCOUNTER — Ambulatory Visit: Payer: BLUE CROSS/BLUE SHIELD | Admitting: Licensed Clinical Social Worker

## 2016-04-15 ENCOUNTER — Encounter: Payer: Self-pay | Admitting: Licensed Clinical Social Worker

## 2016-04-15 ENCOUNTER — Ambulatory Visit (INDEPENDENT_AMBULATORY_CARE_PROVIDER_SITE_OTHER): Payer: BLUE CROSS/BLUE SHIELD | Admitting: Licensed Clinical Social Worker

## 2016-04-15 DIAGNOSIS — F3160 Bipolar disorder, current episode mixed, unspecified: Secondary | ICD-10-CM
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
## 2016-04-21 ENCOUNTER — Telehealth: Payer: Self-pay | Admitting: Psychiatry

## 2016-04-21 ENCOUNTER — Encounter: Payer: Self-pay | Admitting: Psychiatry

## 2016-04-21 ENCOUNTER — Ambulatory Visit (INDEPENDENT_AMBULATORY_CARE_PROVIDER_SITE_OTHER): Payer: BLUE CROSS/BLUE SHIELD | Admitting: Psychiatry

## 2016-04-21 VITALS — BP 122/80 | HR 77 | Temp 97.7°F | Wt 250.2 lb

## 2016-04-21 DIAGNOSIS — F3160 Bipolar disorder, current episode mixed, unspecified: Secondary | ICD-10-CM | POA: Diagnosis not present

## 2016-04-21 MED ORDER — LURASIDONE HCL 20 MG PO TABS: 20.0000 mg | ORAL_TABLET | Freq: Every morning | ORAL | Status: DC

## 2016-04-21 NOTE — Progress Notes (Signed)
Psychiatric Initial Adult Assessment   Patient Identification: Diane James MRN:  [id number] Date of Evaluation:  04/21/2016 Referral Source: Self referred Chief Complaint:   Chief Complaint    Establish Care; Anxiety; Depression; Stress; Fatigue     Visit Diagnosis:    ICD-9-CM ICD-10-CM   1. Bipolar I disorder, most recent episode mixed (HCC) 296.60 F31.60     History of Present Illness:  Patient is a 30 year old Caucasian female who presented today for initial evaluation along with 72-year-old toddler and 60-month-old baby. Patient has been seen by a therapist here and diagnosed with bipolar disorder, mixed episode, ADHD, depression and anxiety. Patient states that she does not really believe she has bipolar disorder and states how she is been having problems with her mental health since age 64. States that she's been on several medications and they have never helped her. She reported that she has been diagnosed as bipolar and has been on medications for that and other than gaining weight she feels they have not helped with her mood. Patient had to be redirected several times as to her current concerns that are bringing her to a psychiatrist. She states that for many years she was on medications through her primary care physician and then realize she needed to see a psychiatrist. Currently she reports that she has taken herself off the Abilify and Wellbutrin 2 weeks ago. States that she feels they were not helping her. She denies any mood symptoms of depression currently. States that she has not been sleeping and has a lot of energy, feels irritable a lot but she is taking good care of her children. States that she is unable to settle down and get any task done. The session was interrupted constantly by her toddler child needing patient's attention. Patient started to get irritable in between. She continued to say that she was not depressed but her current issue was not sleeping. When we  discussed starting a medication to help her sleep and assess her in a few weeks patient stated she does not want to sleep through the night because she needed to be awake in case her toddler son had seizures or her baby had other needs. She states that the only reason she gets depressed is about her weight gain. Patient denied any suicidal thoughts. She does report feeling anxious a lot about her toddler getting another seizure since he already has febrile seizures. She is also very concerned about her 9-year-old son who has ADHD and is currently on a field trip. She states that she is always worried about something bad happening to one of her children. States that she had several miscarriages in the past few years and this seems to amplify today anxiety. Patient is not  keen on starting an antidepressant medication to address her anxiety. Given the level of irritability expressed by patient and her difficulty sleeping along with the distractibility discussed starting her on a mood stabilizer such as Latuda. Patient was initially agreeable and became upset that she did not need to be on a mood stabilizer.  Patient reports that she does not have very much support in this area. States that her husband is supportive but does not understand mental illness. However when she asks him to do something in particular he does help her out. States that her mother does not understand her mental health concerns and was instrumental in not providing her any care since she was a child. States that she does talk to her maternal  aunt who has depression and who is supportive to her.  Associated Signs/Symptoms: Depression Symptoms:  insomnia, psychomotor agitation, anxiety, (Hypo) Manic Symptoms:  Distractibility, Elevated Mood, Flight of Ideas, Irritable Mood, Anxiety Symptoms:  Excessive Worry, Panic Symptoms, Psychotic Symptoms:  denies PTSD Symptoms: Had a traumatic exposure:  Had a miscarriage in september of  2014  Past Psychiatric History: Diagnosed with Bipolar disorder, has been on medications through her primary care physician.  Previous Psychotropic Medications: Yes   Substance Abuse History in the last 12 months:  No.  Consequences of Substance Abuse: Negative  Past Medical History:  Past Medical History  Diagnosis Date  . Bipolar disorder (HCC)   . Neuromuscular disorder (HCC)   . Headache   . Depression   . Anxiety   . Chicken pox   . Migraines   . Enuresis   . ADHD (attention deficit hyperactivity disorder)     Past Surgical History  Procedure Laterality Date  . Ganglion cyst excision      left wrist x3  . Ovarian cyst removal      right ovary  . Dilation and curettage of uterus      Family Psychiatric History: Father has Depression and Anxiety. Maternal aunt has depression and had a suicide attempt.   Family History:  Family History  Problem Relation Age of Onset  . Fibromyalgia Mother   . Cancer Maternal Grandmother     breast cancer  . Anxiety disorder Father   . Depression Father   . ADD / ADHD Brother   . Bipolar disorder Paternal Uncle   . ADD / ADHD Brother     Social History:   Social History   Social History  . Marital Status: Married    Spouse Name: N/A  . Number of Children: N/A  . Years of Education: N/A   Social History Main Topics  . Smoking status: Current Every Day Smoker -- 1.00 packs/day for 7 years    Types: Cigarettes  . Smokeless tobacco: Never Used  . Alcohol Use: No  . Drug Use: No  . Sexual Activity: Yes    Birth Control/ Protection: IUD   Other Topics Concern  . None   Social History Narrative    Additional Social History: Patient is married and had 2 previous marriages. She has 3 children ages 939, 2 and 3 months. 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 Once he started developing seizures she quit her job and stays home with her  children.  Allergies:   Allergies  Allergen Reactions  . Morphine Hives  . Silvadene [Silver Sulfadiazine] Hives    burning  . Morphine And Related     Metabolic Disorder Labs: No results found for: HGBA1C, MPG No results found for: PROLACTIN No results found for: CHOL, TRIG, HDL, CHOLHDL, VLDL, LDLCALC   Current Medications: Current Outpatient Prescriptions  Medication Sig Dispense Refill  . acetaminophen (TYLENOL) 325 MG tablet Take by mouth.    . ARIPiprazole (ABILIFY) 15 MG tablet Take 1 tablet (15 mg total) by mouth daily. 30 tablet 2  . buPROPion (WELLBUTRIN XL) 300 MG 24 hr tablet Take 1 tablet (300 mg total) by mouth daily. 30 tablet 2  . clonazePAM (KLONOPIN) 0.5 MG tablet Reported on 03/30/2016  0  . desmopressin (DDAVP) 0.2 MG tablet Take 3 tablets (600 mcg total) by mouth at bedtime. 90 tablet 2  . loratadine (SM LORATADINE)  5 MG/5ML syrup Take by mouth.     No current facility-administered medications for this visit.    Neurologic: Headache: No Seizure: No Paresthesias:No  Musculoskeletal: Strength & Muscle Tone: within normal limits Gait & Station: normal Patient leans: N/A  Psychiatric Specialty Exam: ROS  Blood pressure 122/80, pulse 77, temperature 97.7 F (36.5 C), temperature source Tympanic, weight 250 lb 3.2 oz (113.49 kg), last menstrual period 03/30/2016, SpO2 97 %, unknown if currently breastfeeding.Body mass index is 42.93 kg/(m^2).  General Appearance: Casual  Eye Contact:  Good  Speech:  Clear and Coherent  Volume:  Normal  Mood:  Anxious and Depressed  Affect:  Congruent  Thought Process:  Coherent  Orientation:  Full (Time, Place, and Person)  Thought Content:  WDL  Suicidal Thoughts:  No  Homicidal Thoughts:  No  Memory:  Immediate;   Fair Recent;   Fair Remote;   Fair  Judgement:  Fair  Insight:  Fair  Psychomotor Activity:  Normal  Concentration:  Fair  Recall:  [employer] of Knowledge:Fair  Language: Fair  Akathisia:  No   Handed:  Right  AIMS (if indicated):    Assets:  Communication Skills Desire for Improvement Financial Resources/Insurance Housing Social Support  ADL's:  Intact  Cognition: WNL  Sleep:  poor    Treatment Plan Summary: Medication management   Bipolar disorder mixed Discussed starting Latuda at 20 mg once daily  to address her irritability, insomnia and anxiety. Patient appeared to be agreeable initially and then became very irritable. She told this clinician that she did not feel that she was heard and that she was being put on a mood stabilizer though she did not need one. When asked as to what did she think she needed she stated that she did not know. She was given [facility] information so she can obtain an evaluation for ADD/ADHD. She became more irritable and stated that she is already been through multiple evaluations by this clinician and the therapist. Patient was educated about [facility] and how it can help with various symptoms and the side effects discussed as well. Patient was asked to come back in 2 weeks and at that point she stated that she was not going to come back. She requested the front desk staff to change the M.D.  Insomnia We also discussed starting her on trazodone at 50 mg to help with her insomnia. Patient however stated that she is not interested in taking a sleep medication since she wants to be awake to be able to watch over her children.  Patient was to return to clinic in 2 weeks time but since she is not.wanting to see this clinician she will be referred to another M.D. or given a list of providers to pursue care.   Patrick North, MD 5/11/201711:04 AM

## 2016-04-26 ENCOUNTER — Telehealth: Payer: Self-pay

## 2016-04-26 NOTE — Telephone Encounter (Signed)
DID PRIOR AUTH ON [email or web address] - PRIOR AUTH SUBMITTED PENDING APPROVAL.

## 2016-04-26 NOTE — Telephone Encounter (Signed)
RECEIVED A FAX REQUESTING A PRIOR AUTH TO BE DONE FOR MEDICATION  LATUDA 20MG  TAKE 1 TABLET BY MOUTH EVERY MORNING #30.

## 2016-04-26 NOTE — Telephone Encounter (Signed)
PRIOR AUTH WAS FAXED TO PHARMACY

## 2016-04-26 NOTE — Telephone Encounter (Signed)
I DID ANOTHER REQUEST, STILL HAVE NOT HEARD ANYTHING SO I DID ANOTHER REQUEST.

## 2016-04-26 NOTE — Telephone Encounter (Signed)
RECEIVED FAX FROM [employer] THAT LATUDA WAS APPROVED.  EFFECTIVE DATE 04-22-16 TO  12-11-2038.  REFERENCE # [id number]

## 2016-04-29 ENCOUNTER — Ambulatory Visit (INDEPENDENT_AMBULATORY_CARE_PROVIDER_SITE_OTHER): Payer: BLUE CROSS/BLUE SHIELD | Admitting: Family Medicine

## 2016-04-29 ENCOUNTER — Encounter: Payer: Self-pay | Admitting: Family Medicine

## 2016-04-29 VITALS — BP 183/87 | HR 94 | Resp 16 | Ht 64.0 in | Wt 248.0 lb

## 2016-04-29 DIAGNOSIS — F909 Attention-deficit hyperactivity disorder, unspecified type: Secondary | ICD-10-CM

## 2016-04-29 DIAGNOSIS — F988 Other specified behavioral and emotional disorders with onset usually occurring in childhood and adolescence: Secondary | ICD-10-CM

## 2016-04-29 DIAGNOSIS — F418 Other specified anxiety disorders: Secondary | ICD-10-CM | POA: Diagnosis not present

## 2016-04-29 DIAGNOSIS — F329 Major depressive disorder, single episode, unspecified: Secondary | ICD-10-CM

## 2016-04-29 DIAGNOSIS — F32A Depression, unspecified: Secondary | ICD-10-CM

## 2016-04-29 DIAGNOSIS — F319 Bipolar disorder, unspecified: Secondary | ICD-10-CM | POA: Diagnosis not present

## 2016-04-29 DIAGNOSIS — F419 Anxiety disorder, unspecified: Principal | ICD-10-CM

## 2016-04-29 MED ORDER — BUPROPION HCL ER (XL) 300 MG PO TB24: 300.0000 mg | ORAL_TABLET | Freq: Every day | ORAL | Status: DC

## 2016-05-02 NOTE — Progress Notes (Signed)
Date:  04/29/2016   Name:  Diane James   DOB:  [birth year]   MRN:  [id number]  PCP:  [facility]    Chief Complaint: Manic Behavior   History of Present Illness:  This is a 30 y.o. female seen in one month f/u. Has been seen at [facility] since last visit, saw therapist then psychiatrist, told to stop Wellbutrin and Abilify and start Latuda but too expensive so never filled rx. PT not convinced she has bipolar d/o. Also wanted rx for ADHD, referred for neuropsych testing, has appt in [place] at end of month. Has two young kids with no one to watch.   Review of Systems:  Review of Systems  Constitutional: Negative for fever and fatigue.  Respiratory: Negative for cough and shortness of breath.   Cardiovascular: Negative for chest pain and leg swelling.  Endocrine: Negative for polyuria.  Genitourinary: Negative for difficulty urinating.  Neurological: Negative for syncope and light-headedness.    Patient Active Problem List   Diagnosis Date Noted  . Obesity, Class III, BMI 40-49.9 (morbid obesity) (HCC) 03/30/2016  . Enuresis 03/30/2016  . Allergic rhinitis 03/30/2016  . ADD (attention deficit disorder) 03/30/2016  . Obesity affecting pregnancy, antepartum 07/09/2015  . High-risk pregnancy 09/02/2014  . Other specified abnormal immunological findings in serum 11/14/2013  . Anxiety and depression 03/14/2013  . Bipolar I disorder (HCC) 03/14/2013  . Lumbar strain 04/30/2012    Prior to Admission medications   Medication Sig Start Date End Date Taking? Authorizing Provider  acetaminophen (TYLENOL) 325 MG tablet Take by mouth.   Yes Historical Provider, MD  buPROPion (WELLBUTRIN XL) 300 MG 24 hr tablet Take 1 tablet (300 mg total) by mouth daily. 04/29/16  Yes Schuyler Amor, MD  clonazePAM Scarlette Calico) 0.5 MG tablet Reported on 04/21/2016 01/27/16  Yes Historical Provider, MD  desmopressin (DDAVP) 0.2 MG tablet Take 3 tablets (600 mcg total) by mouth at bedtime.  03/30/16  Yes Schuyler Amor, MD    Allergies  Allergen Reactions  . Morphine Hives  . Silvadene [Silver Sulfadiazine] Hives    burning  . Morphine And Related     Past Surgical History  Procedure Laterality Date  . Ganglion cyst excision      left wrist x3  . Ovarian cyst removal      right ovary  . Dilation and curettage of uterus      Social History  Substance Use Topics  . Smoking status: Current Every Day Smoker -- 1.00 packs/day for 7 years    Types: Cigarettes  . Smokeless tobacco: Never Used  . Alcohol Use: No    Family History  Problem Relation Age of Onset  . Fibromyalgia Mother   . Cancer Maternal Grandmother     breast cancer  . Anxiety disorder Father   . Depression Father   . ADD / ADHD Brother   . Bipolar disorder Paternal Uncle   . ADD / ADHD Brother     Medication list has been reviewed and updated.  Physical Examination: BP 183/87 mmHg  Pulse 94  Resp 16  Ht  (1.626 m)  Wt 248 lb (112.492 kg)  BMI 42.55 kg/m2  LMP 03/30/2016  Physical Exam  Constitutional: She appears well-developed and well-nourished.  Cardiovascular: Normal rate, regular rhythm and normal heart sounds.   Pulmonary/Chest: Effort normal and breath sounds normal.  Musculoskeletal: She exhibits no edema.  Neurological: She is alert.  Skin: Skin is warm and dry.  Psychiatric: She  has a normal mood and affect. Her behavior is normal.  Nursing note and vitals reviewed.   Assessment and Plan:  1. Anxiety and depression Worse off Wellbutrin/Abilify, recommend restart Wellbutrin (has at home) until psych f/u  2. Bipolar I disorder (HCC) Unable to afford Latuda, to discuss at psych f/u  3. Obesity, Class III, BMI 40-49.9 (morbid obesity) (HCC) Weight up 3#, weight loss discussed  4. ADD (attention deficit disorder) Possible, for neuropsych testing later this month  Return in about 4 weeks (around 05/27/2016).  Dionne AnoWilliam M. Kingsley SpittlePlonk, Jr. MD [facility]  05/02/2016

## 2016-05-05 ENCOUNTER — Ambulatory Visit: Payer: No Typology Code available for payment source | Admitting: Psychiatry

## 2016-05-13 ENCOUNTER — Ambulatory Visit: Payer: BLUE CROSS/BLUE SHIELD | Admitting: Licensed Clinical Social Worker

## 2016-07-15 ENCOUNTER — Other Ambulatory Visit: Payer: Self-pay

## 2016-07-15 MED ORDER — DESMOPRESSIN ACETATE 0.2 MG PO TABS: 600.0000 ug | ORAL_TABLET | Freq: Every day | ORAL | 2 refills | Status: DC

## 2016-08-25 ENCOUNTER — Ambulatory Visit
Admission: EM | Admit: 2016-08-25 | Discharge: 2016-08-25 | Disposition: A | Discharge status: Home / Self Care | Payer: BLUE CROSS/BLUE SHIELD | Attending: Internal Medicine | Admitting: Internal Medicine

## 2016-08-25 ENCOUNTER — Encounter: Payer: Self-pay | Admitting: Emergency Medicine

## 2016-08-25 DIAGNOSIS — M25562 Pain in left knee: Secondary | ICD-10-CM

## 2016-08-25 DIAGNOSIS — H1033 Unspecified acute conjunctivitis, bilateral: Secondary | ICD-10-CM | POA: Diagnosis not present

## 2016-08-25 LAB — COMPREHENSIVE METABOLIC PANEL
ALBUMIN: 4.2 g/dL (ref 3.5–5.0)
ALT: 20 U/L (ref 14–54)
ANION GAP: 7 (ref 5–15)
AST: 21 U/L (ref 15–41)
Alkaline Phosphatase: 84 U/L (ref 38–126)
BUN: 13 mg/dL (ref 6–20)
CALCIUM: 9.2 mg/dL (ref 8.9–10.3)
CHLORIDE: 107 mmol/L (ref 101–111)
CO2: 23 mmol/L (ref 22–32)
Creatinine, Ser: 0.56 mg/dL (ref 0.44–1.00)
GFR calc Af Amer: >60 mL/min (ref >60)
GFR calc non Af Amer: >60 mL/min (ref >60)
GLUCOSE: 95 mg/dL (ref 65–99)
POTASSIUM: 4.2 mmol/L (ref 3.5–5.1)
SODIUM: 137 mmol/L (ref 135–145)
TOTAL PROTEIN: 7.9 g/dL (ref 6.5–8.1)
Total Bilirubin: 0.5 mg/dL (ref 0.3–1.2)

## 2016-08-25 LAB — CBC WITH DIFFERENTIAL/PLATELET
BASOS ABS: 0 10*3/uL (ref 0–0.1)
BASOS PCT: 0 %
EOS ABS: 0.2 10*3/uL (ref 0–0.7)
Eosinophils Relative: 2 %
HEMATOCRIT: 44.7 % (ref 35.0–47.0)
Hemoglobin: 15.6 g/dL (ref 12.0–16.0)
Lymphocytes Relative: 39 %
Lymphs Abs: 3.4 10*3/uL (ref 1.0–3.6)
MCH: 32.8 pg (ref 26.0–34.0)
MCHC: 34.9 g/dL (ref 32.0–36.0)
MCV: 94 fL (ref 80.0–100.0)
MONO ABS: 0.5 10*3/uL (ref 0.2–0.9)
MONOS PCT: 6 %
NEUTROS ABS: 4.6 10*3/uL (ref 1.4–6.5)
NEUTROS PCT: 53 %
Platelets: 253 10*3/uL (ref 150–440)
RBC: 4.75 MIL/uL (ref 3.80–5.20)
RDW: 13.1 % (ref 11.5–14.5)
WBC: 8.6 10*3/uL (ref 3.6–11.0)

## 2016-08-25 LAB — SEDIMENTATION RATE: SED RATE: 17 mm/h (ref 0–20)

## 2016-08-25 MED ORDER — NAPROXEN 500 MG PO TABS: 500.0000 mg | ORAL_TABLET | Freq: Two times a day (BID) | ORAL | 0 refills | Status: DC

## 2016-08-25 MED ORDER — DOXYCYCLINE HYCLATE 100 MG PO CAPS: 100.0000 mg | ORAL_CAPSULE | Freq: Two times a day (BID) | ORAL | 0 refills | Status: DC

## 2016-08-25 MED ORDER — HYDROCORTISONE VALERATE 0.2 % EX OINT
1.0000 | TOPICAL_OINTMENT | Freq: Every day | CUTANEOUS | 0 refills | Status: DC
Start: 2016-08-25 — End: 2016-11-24

## 2016-08-25 NOTE — ED Triage Notes (Signed)
Patient c/o drainage in her right eye that started 2 days ago.  Patient c/o pain in her left knee that started today.  Patient denies injury.

## 2016-08-25 NOTE — ED Provider Notes (Signed)
[facility]    CSN: [id number] Arrival date & time: 08/25/16  1825  First Provider Contact:  First MD Initiated Contact with Patient 08/25/16 1918        History   Chief Complaint Chief Complaint  Patient presents with  . Eye Problem  . Knee Pain    HPI Diane James is a 30 y.o. female. She presents with crusty sore patches around/under both eyes at lateral corners with slight eye redness/drainage, onset in the last 24 hours.  Has had "dry patches" in the same location intermittently in the past, never became this severe.  Blurry vision intermittently.   Also pain in posterior L knee, started this morning, no known injury, no unusual activities identified.  Not red/swollen, but some decrease in ROM due to pain.   Also reports hair loss, inflammatory skin bumps, achiness since birth of 3rd child 7 mos ago. Worried about thyroid issue.  HPI  Past Medical History:  Diagnosis Date  . ADHD (attention deficit hyperactivity disorder)   . Anxiety   . Bipolar disorder (HCC)   . Chicken pox   . Depression   . Enuresis   . Headache   . Migraines   . Neuromuscular disorder [facility])     Patient Active Problem List   Diagnosis Date Noted  . Obesity, Class III, BMI 40-49.9 (morbid obesity) (HCC) 03/30/2016  . Enuresis 03/30/2016  . Allergic rhinitis 03/30/2016  . ADD (attention deficit disorder) 03/30/2016  . Obesity affecting pregnancy, antepartum 07/09/2015  . High-risk pregnancy 09/02/2014  . Other specified abnormal immunological findings in serum 11/14/2013  . Anxiety and depression 03/14/2013  . Bipolar I disorder (HCC) 03/14/2013  . Lumbar strain 04/30/2012    Past Surgical History:  Procedure Laterality Date  . DILATION AND CURETTAGE OF UTERUS    . GANGLION CYST EXCISION     left wrist x3  . OVARIAN CYST REMOVAL     right ovary    OB History    Gravida Para Term Preterm AB Living   6 3 2 1 2 2    SAB TAB Ectopic Multiple Live Births   2               Obstetric Comments   3rd pregnancy was stillborn       Home Medications    Prior to Admission medications   Medication Sig Start Date End Date Taking? Authorizing Provider  acetaminophen (TYLENOL) 325 MG tablet Take by mouth.    Historical Provider, MD  clonazePAM Scarlette Calico) 0.5 MG tablet Reported on 04/21/2016 01/27/16   Historical Provider, MD  desmopressin (DDAVP) 0.2 MG tablet Take 3 tablets (600 mcg total) by mouth at bedtime. 07/15/16   Reubin Milan, MD    Family History Family History  Problem Relation Age of Onset  . Fibromyalgia Mother   . Cancer Maternal Grandmother     breast cancer  . Anxiety disorder Father   . Depression Father   . ADD / ADHD Brother   . ADD / ADHD Brother   . Bipolar disorder Paternal Uncle     Social History Social History  Substance Use Topics  . Smoking status: Former Smoker    Packs/day: 1.00    Years: 7.00    Types: Cigarettes  . Smokeless tobacco: Never Used  . Alcohol use No     Allergies   Morphine; Silvadene [silver sulfadiazine]; and Morphine and related   Review of Systems Review of Systems  All other systems  reviewed and are negative.    Physical Exam Triage Vital Signs ED Triage Vitals  Enc Vitals Group     BP 08/25/16 1840 129/86     Pulse Rate 08/25/16 1840 73     Resp 08/25/16 1840 16     Temp 08/25/16 1840 97.8 F (36.6 C)     Temp Source 08/25/16 1840 Tympanic     SpO2 08/25/16 1840 99 %     Weight 08/25/16 1843 248 lb (112.5 kg)     Height 08/25/16 1843 5\' 4"  (1.626 m)     Pain Score 08/25/16 1845 6   Updated Vital Signs BP 129/86 (BP Location: Left Arm)   Pulse 73   Temp 97.8 F (36.6 C) (Tympanic)   Resp 16   Ht 5\' 4"  (1.626 m)   Wt 248 lb (112.5 kg)   LMP 08/21/2016 (Approximate)   SpO2 99%   Breastfeeding? No   BMI 42.57 kg/m   Visual Acuity Right Eye Distance: 20/40 corrected Left Eye Distance: 20/30 corrected Bilateral Distance:     Physical Exam  Constitutional: She  is oriented to person, place, and time. No distress.  Alert, nicely groomed  HENT:  Head: Atraumatic.  B TMs mod dull no erythema Mod nasal congestion Throat a little red  Eyes:  Conjugate gaze, very minimal bilat conj injection At lateral aspects of both eyes, around/under, there are superficial erosions with erythema/crusting vs yellowish scale, ?seborrheic changes.  Tender. PERRL, EOMI  Neck: Neck supple.  Cardiovascular: Normal rate.   Pulmonary/Chest: No respiratory distress.  Lungs clear, symmetric breath sounds  Abdominal: She exhibits no distension.  Musculoskeletal: Normal range of motion.  No leg swelling No swelling/bruising/erythema L knee.   Slightly limited flexion/extension on left due to discomfort, 'tight' sensation Symmetric ankle contours, no pitting edema distally. Able to walk independently with slight limp  Neurological: She is alert and oriented to person, place, and time.  Skin: Skin is warm and dry.  No cyanosis  Nursing note and vitals reviewed.    UC Treatments / Results  Labs Results for orders placed or performed during the hospital encounter of 08/25/16  CBC with Differential  Result Value Ref Range   WBC 8.6 3.6 - 11.0 K/uL   RBC 4.75 3.80 - 5.20 MIL/uL   Hemoglobin 15.6 12.0 - 16.0 g/dL   HCT 09.8 11.9 - 14.7 %   MCV 94.0 80.0 - 100.0 fL   MCH 32.8 26.0 - 34.0 pg   MCHC 34.9 32.0 - 36.0 g/dL   RDW 82.9 56.2 - 13.0 %   Platelets 253 150 - 440 K/uL   Neutrophils Relative % 53 %   Neutro Abs 4.6 1.4 - 6.5 K/uL   Lymphocytes Relative 39 %   Lymphs Abs 3.4 1.0 - 3.6 K/uL   Monocytes Relative 6 %   Monocytes Absolute 0.5 0.2 - 0.9 K/uL   Eosinophils Relative 2 %   Eosinophils Absolute 0.2 0 - 0.7 K/uL   Basophils Relative 0 %   Basophils Absolute 0.0 0 - 0.1 K/uL  Comprehensive metabolic panel  Result Value Ref Range   Sodium 137 135 - 145 mmol/L   Potassium 4.2 3.5 - 5.1 mmol/L   Chloride 107 101 - 111 mmol/L   CO2 23 22 - 32 mmol/L    Glucose, Bld 95 65 - 99 mg/dL   BUN 13 6 - 20 mg/dL   Creatinine, Ser 8.65 0.44 - 1.00 mg/dL   Calcium 9.2 8.9 - 10.3  mg/dL   Total Protein 7.9 6.5 - 8.1 g/dL   Albumin 4.2 3.5 - 5.0 g/dL   AST 21 15 - 41 U/L   ALT 20 14 - 54 U/L   Alkaline Phosphatase 84 38 - 126 U/L   Total Bilirubin 0.5 0.3 - 1.2 mg/dL   GFR calc non Af Amer >60 >60 mL/min   GFR calc Af Amer >60 >60 mL/min   Anion gap 7 5 - 15  TSH  Result Value Ref Range   TSH 1.012 0.350 - 4.500 uIU/mL  Sedimentation rate  Result Value Ref Range   Sed Rate 17 0 - 20 mm/hr    Procedures Procedures (including critical care time)      None    Final Clinical Impressions(s) / UC Diagnoses   Final diagnoses:  Conjunctivitis, acute, bilateral  Acute knee pain, left   Prescriptions for sore eyes and L knee were sent to [facility] in [place]. Use tiny amount of hydrocortisone ointment under lower eyelashes once daily.  Take doxycycline (antibiotic for eyes) and naproxen (anti inflammatory) for knee.  Need to arrange followup with primary care provider in the next couple weeks. Labs tonight (blood counts, liver and kidney tests) were normal.  Thyroid test and sed rate (estimates inflammation) are pending.  We will call you if they are abnormal.    New Prescriptions Discharge Medication List as of 08/25/2016  8:20 PM    START taking these medications   Details  doxycycline (VIBRAMYCIN) 100 MG capsule Take 1 capsule (100 mg total) by mouth 2 (two) times daily., Starting Thu 08/25/2016, Normal    hydrocortisone valerate ointment (WESTCORT) 0.2 % Apply 1 application topically daily., Starting Thu 08/25/2016, Normal    naproxen (NAPROSYN) 500 MG tablet Take 1 tablet (500 mg total) by mouth 2 (two) times daily., Starting Thu 08/25/2016, Normal         Eustace MooreLaura W Xue Low, MD 08/28/16 [phone]

## 2016-08-25 NOTE — Discharge Instructions (Addendum)
Prescriptions for sore eyes and L knee were sent to [facility] in [place]. Use tiny amount of hydrocortisone ointment under lower eyelashes once daily.  Take doxycycline (antibiotic for eyes) and naproxen (anti inflammatory) for knee.  Need to arrange followup with primary care provider in the next couple weeks. Labs tonight (blood counts, liver and kidney tests) were normal.  Thyroid test and sed rate (estimates inflammation) are pending.  We will call you if they are abnormal.

## 2016-08-26 LAB — TSH: TSH: 1.012 u[IU]/mL (ref 0.350–4.500)

## 2016-09-14 ENCOUNTER — Encounter: Payer: Self-pay | Admitting: *Deleted

## 2016-09-14 ENCOUNTER — Ambulatory Visit
Admission: EM | Admit: 2016-09-14 | Discharge: 2016-09-14 | Disposition: A | Discharge status: Home / Self Care | Payer: BLUE CROSS/BLUE SHIELD | Attending: Family Medicine | Admitting: Family Medicine

## 2016-09-14 DIAGNOSIS — H1031 Unspecified acute conjunctivitis, right eye: Secondary | ICD-10-CM | POA: Diagnosis not present

## 2016-09-14 MED ORDER — MOXIFLOXACIN HCL 0.5 % OP SOLN: 1.0000 [drp] | Freq: Three times a day (TID) | OPHTHALMIC | 0 refills | Status: DC

## 2016-09-14 NOTE — ED Provider Notes (Signed)
[facility]    CSN: [id number] Arrival date & time: 09/14/16  1920     History   Chief Complaint Chief Complaint  Patient presents with  . Conjunctivitis    HPI Diane James is a 30 y.o. female.   The history is provided by the patient.  Conjunctivitis  This is a new problem. The current episode started more than 2 days ago. The problem occurs constantly. The problem has been gradually worsening. Pertinent negatives include no chest pain, no abdominal pain, no headaches and no shortness of breath. Associated symptoms comments: Yellow thick drainage and redness from right eye. Nothing relieves the symptoms. She has tried nothing for the symptoms.    Past Medical History:  Diagnosis Date  . ADHD (attention deficit hyperactivity disorder)   . Anxiety   . Bipolar disorder (HCC)   . Chicken pox   . Depression   . Enuresis   . Headache   . Migraines   . Neuromuscular disorder [facility](HCC)     Patient Active Problem List   Diagnosis Date Noted  . Obesity, Class III, BMI 40-49.9 (morbid obesity) (HCC) 03/30/2016  . Enuresis 03/30/2016  . Allergic rhinitis 03/30/2016  . ADD (attention deficit disorder) 03/30/2016  . Obesity affecting pregnancy, antepartum 07/09/2015  . High-risk pregnancy 09/02/2014  . Other specified abnormal immunological findings in serum 11/14/2013  . Anxiety and depression 03/14/2013  . Bipolar I disorder (HCC) 03/14/2013  . Lumbar strain 04/30/2012    Past Surgical History:  Procedure Laterality Date  . DILATION AND CURETTAGE OF UTERUS    . GANGLION CYST EXCISION     left wrist x3  . OVARIAN CYST REMOVAL     right ovary    OB History    Gravida Para Term Preterm AB Living   6 3 2 1 2 2    SAB TAB Ectopic Multiple Live Births   2              Obstetric Comments   3rd pregnancy was stillborn       Home Medications    Prior to Admission medications   Medication Sig Start Date End Date Taking? Authorizing Provider    desmopressin (DDAVP) 0.2 MG tablet Take 3 tablets (600 mcg total) by mouth at bedtime. 07/15/16  Yes Reubin MilanLaura H Berglund, MD  acetaminophen (TYLENOL) 325 MG tablet Take by mouth.    Historical Provider, MD  clonazePAM Scarlette Calico(KLONOPIN) 0.5 MG tablet Reported on 04/21/2016 01/27/16   Historical Provider, MD  doxycycline (VIBRAMYCIN) 100 MG capsule Take 1 capsule (100 mg total) by mouth 2 (two) times daily. 08/25/16   Eustace MooreLaura W Murray, MD  hydrocortisone valerate ointment (WESTCORT) 0.2 % Apply 1 application topically daily. 08/25/16   Eustace MooreLaura W Murray, MD  moxifloxacin (VIGAMOX) 0.5 % ophthalmic solution Place 1 drop into the right eye 3 (three) times daily. 09/14/16   Payton Mccallumrlando Amaru Burroughs, MD  naproxen (NAPROSYN) 500 MG tablet Take 1 tablet (500 mg total) by mouth 2 (two) times daily. 08/25/16   Eustace MooreLaura W Murray, MD    Family History Family History  Problem Relation Age of Onset  . Fibromyalgia Mother   . Cancer Maternal Grandmother     breast cancer  . Anxiety disorder Father   . Depression Father   . ADD / ADHD Brother   . ADD / ADHD Brother   . Bipolar disorder Paternal Uncle     Social History Social History  Substance Use Topics  . Smoking status: Former Smoker  Packs/day: 1.00    Years: 7.00    Types: Cigarettes  . Smokeless tobacco: Never Used  . Alcohol use No     Allergies   Morphine and Silvadene [silver sulfadiazine]   Review of Systems Review of Systems  Respiratory: Negative for shortness of breath.   Cardiovascular: Negative for chest pain.  Gastrointestinal: Negative for abdominal pain.  Neurological: Negative for headaches.     Physical Exam Triage Vital Signs ED Triage Vitals  Enc Vitals Group     BP 09/14/16 1932 127/65     Pulse Rate 09/14/16 1932 66     Resp 09/14/16 1932 16     Temp 09/14/16 1932 98.1 F (36.7 C)     Temp Source 09/14/16 1932 Oral     SpO2 09/14/16 1932 98 %     Weight 09/14/16 1933 248 lb (112.5 kg)     Height 09/14/16 1933 5\' 4"  (1.626 m)      Head Circumference --      Peak Flow --      Pain Score 09/14/16 1937 4     Pain Loc --      Pain Edu? --      Excl. in GC? --    No data found.   Updated Vital Signs BP 127/65 (BP Location: Left Arm)   Pulse 66   Temp 98.1 F (36.7 C) (Oral)   Resp 16   Ht 5\' 4"  (1.626 m)   Wt 248 lb (112.5 kg)   LMP 08/21/2016 (Approximate)   SpO2 98%   BMI 42.57 kg/m   Visual Acuity Right Eye Distance:   Left Eye Distance:   Bilateral Distance:    Right Eye Near:   Left Eye Near:    Bilateral Near:     Physical Exam  Constitutional: She appears well-developed and well-nourished. No distress.  Eyes: EOM are normal. Pupils are equal, round, and reactive to light. Right eye exhibits discharge. Left eye exhibits no discharge. Right conjunctiva is injected. Left conjunctiva is not injected.  Skin: She is not diaphoretic.  Nursing note and vitals reviewed.    UC Treatments / Results  Labs (all labs ordered are listed, but only abnormal results are displayed) Labs Reviewed - No data to display  EKG  EKG Interpretation None       Radiology No results found.  Procedures Procedures (including critical care time)  Medications Ordered in UC Medications - No data to display   Initial Impression / Assessment and Plan / UC Course  I have reviewed the triage vital signs and the nursing notes.  Pertinent labs & imaging results that were available during my care of the patient were reviewed by me and considered in my medical decision making (see chart for details).  Clinical Course      Final Clinical Impressions(s) / UC Diagnoses   Final diagnoses:  Acute bacterial conjunctivitis of right eye    New Prescriptions Discharge Medication List as of 09/14/2016  7:45 PM    START taking these medications   Details  moxifloxacin (VIGAMOX) 0.5 % ophthalmic solution Place 1 drop into the right eye 3 (three) times daily., Starting Wed 09/14/2016, Normal       1. diagnosis  reviewed with patient 2. rx as per orders above; reviewed possible side effects, interactions, risks and benefits  3. Recommend supportive treatment with cool compresses 4. Follow-up prn if symptoms worsen or don't improve   Payton Mccallum, MD 09/14/16 1950

## 2016-09-14 NOTE — ED Triage Notes (Signed)
Patient started having symptoms of left eye redness and drainage this AM.

## 2016-10-18 ENCOUNTER — Other Ambulatory Visit: Payer: Self-pay | Admitting: Internal Medicine

## 2016-11-02 NOTE — Telephone Encounter (Signed)
S/w pt, pt said she would call back and make this appt

## 2016-11-07 ENCOUNTER — Encounter: Payer: Self-pay | Admitting: *Deleted

## 2016-11-07 ENCOUNTER — Ambulatory Visit
Admission: EM | Admit: 2016-11-07 | Discharge: 2016-11-07 | Disposition: A | Discharge status: Home / Self Care | Payer: BLUE CROSS/BLUE SHIELD | Attending: Family Medicine | Admitting: Family Medicine

## 2016-11-07 DIAGNOSIS — T7840XA Allergy, unspecified, initial encounter: Secondary | ICD-10-CM

## 2016-11-07 DIAGNOSIS — L259 Unspecified contact dermatitis, unspecified cause: Secondary | ICD-10-CM

## 2016-11-07 DIAGNOSIS — R21 Rash and other nonspecific skin eruption: Secondary | ICD-10-CM

## 2016-11-07 MED ORDER — PREDNISONE 10 MG (21) PO TBPK: ORAL_TABLET | ORAL | 0 refills | Status: DC

## 2016-11-07 MED ORDER — FEXOFENADINE HCL 60 MG PO TABS: 60.0000 mg | ORAL_TABLET | Freq: Two times a day (BID) | ORAL | 0 refills | Status: DC

## 2016-11-07 MED ORDER — RANITIDINE HCL 150 MG PO CAPS: 150.0000 mg | ORAL_CAPSULE | Freq: Two times a day (BID) | ORAL | 0 refills | Status: DC

## 2016-11-07 MED ORDER — METHYLPREDNISOLONE SODIUM SUCC 125 MG IJ SOLR
125.0000 mg | Freq: Once | INTRAMUSCULAR | Status: AC
  Administered 2016-11-07: 125 mg via INTRAMUSCULAR

## 2016-11-07 MED ORDER — EPINEPHRINE PF 1 MG/ML IJ SOLN
0.3000 mg | Freq: Once | INTRAMUSCULAR | Status: AC
  Administered 2016-11-07: 0.3 mg via SUBCUTANEOUS

## 2016-11-07 MED ORDER — FAMOTIDINE 20 MG PO TABS
20.0000 mg | ORAL_TABLET | Freq: Once | ORAL | Status: AC
  Administered 2016-11-07: 20 mg via ORAL

## 2016-11-07 NOTE — ED Triage Notes (Signed)
Patient started having symptom of rash on her left elbow and forearm today. OTC creams have not resolved symptoms.

## 2016-11-07 NOTE — ED Provider Notes (Signed)
[facility]    CSN: [id number] Arrival date & time: 11/07/16  1525     History   Chief Complaint Chief Complaint  Patient presents with  . Rash    HPI Diane James is a 30 y.o. female.   Patient states that Diane James does not know what Diane James came in contact with but Diane James broke out on her left arm and elbow. Diane James states that Diane James's has a tremendous allergy and reaction to poison ivy or poison oak Diane James's had near anaphylaxis Diane James's been exposed to those things. Diane James has not been outside but states that something Diane James got contact with at work because the left arm and forearm and elbow to break out in a red rash raised and very pruritic. Diane James states that while Diane James doesn't feel like her throat is closing over Diane James feels warm and funny all over and reports some hoarseness and funny sensation over her body. As stated above Diane James has had a history of near anaphylaxis before. Diane James denies any new soap detergent insect bites or any other known allergen that caused this problem. This happened work about 3 hours ago is on progressive gotten worse. Diane James denies any significant medical problems. Diane James does not smoke. No known drug allergies. Diane James's had a ganglion cyst removed, ovarian cyst removed and Diane James's had a D&C. Diane James is a gravida 6 para 3 and 2 ectopics and one stillborn. Past medical history includes bipolar disorder anxiety depression migraines and neuromuscular disease unspecified. Family history is positive for fibromyalgia cancer anxiety disorder depression and family with ADD and ADHD and bipolar disorder.   The history is provided by the patient. No language interpreter was used.  Rash  Location:  Shoulder/arm Shoulder/arm rash location:  L elbow, L forearm and L upper arm Quality: burning, dryness, itchiness, redness and swelling   Severity:  Severe Onset quality:  Sudden Duration:  3 hours Timing:  Constant Progression:  Worsening Chronicity:  New Context: not chemical exposure, not  exposure to similar rash, not insect bite/sting, not new detergent/soap, not plant contact, not pregnancy, not sick contacts and not sun exposure   Relieved by:  Nothing Worsened by:  Nothing Ineffective treatments:  Topical steroids Associated symptoms: hoarse voice and myalgias     Past Medical History:  Diagnosis Date  . ADHD (attention deficit hyperactivity disorder)   . Anxiety   . Bipolar disorder (HCC)   . Chicken pox   . Depression   . Enuresis   . Headache   . Migraines   . Neuromuscular disorder [facility](HCC)     Patient Active Problem List   Diagnosis Date Noted  . Obesity, Class III, BMI 40-49.9 (morbid obesity) (HCC) 03/30/2016  . Enuresis 03/30/2016  . Allergic rhinitis 03/30/2016  . ADD (attention deficit disorder) 03/30/2016  . Obesity affecting pregnancy, antepartum 07/09/2015  . High-risk pregnancy 09/02/2014  . Other specified abnormal immunological findings in serum 11/14/2013  . Anxiety and depression 03/14/2013  . Bipolar I disorder (HCC) 03/14/2013  . Lumbar strain 04/30/2012    Past Surgical History:  Procedure Laterality Date  . DILATION AND CURETTAGE OF UTERUS    . GANGLION CYST EXCISION     left wrist x3  . OVARIAN CYST REMOVAL     right ovary    OB History    Gravida Para Term Preterm AB Living   6 3 2 1 2 2    SAB TAB Ectopic Multiple Live Births   2  Obstetric Comments   3rd pregnancy was stillborn       Home Medications    Prior to Admission medications   Medication Sig Start Date End Date Taking? Authorizing Provider  acetaminophen (TYLENOL) 325 MG tablet Take by mouth.   Yes Historical Provider, MD  desmopressin (DDAVP) 0.2 MG tablet TAKE 3 TABLETS BY MOUTH AT BEDTIME. 10/18/16  Yes Reubin Milan, MD  hydrocortisone valerate ointment (WESTCORT) 0.2 % Apply 1 application topically daily. 08/25/16  Yes Eustace Moore, MD  naproxen (NAPROSYN) 500 MG tablet Take 1 tablet (500 mg total) by mouth 2 (two) times daily. 08/25/16   Yes Eustace Moore, MD  clonazePAM Scarlette Calico) 0.5 MG tablet Reported on 04/21/2016 01/27/16   Historical Provider, MD  doxycycline (VIBRAMYCIN) 100 MG capsule Take 1 capsule (100 mg total) by mouth 2 (two) times daily. 08/25/16   Eustace Moore, MD  fexofenadine (ALLEGRA) 60 MG tablet Take 1 tablet (60 mg total) by mouth 2 (two) times daily. 11/07/16   Hassan Rowan, MD  moxifloxacin (VIGAMOX) 0.5 % ophthalmic solution Place 1 drop into the right eye 3 (three) times daily. 09/14/16   Payton Mccallum, MD  predniSONE (STERAPRED UNI-PAK 21 TAB) 10 MG (21) TBPK tablet 6 tabs day 1 and 2, 5 tabs day 3 and 4, 4 tabs day 5 and 6, 3 tabs day 7 and 8, 2 tabs day 9 and 10, 1 tab day 11 and 12. Take orally 11/07/16   Hassan Rowan, MD  ranitidine (ZANTAC) 150 MG capsule Take 1 capsule (150 mg total) by mouth 2 (two) times daily. 11/07/16   Hassan Rowan, MD    Family History Family History  Problem Relation Age of Onset  . Fibromyalgia Mother   . Cancer Maternal Grandmother     breast cancer  . Anxiety disorder Father   . Depression Father   . ADD / ADHD Brother   . ADD / ADHD Brother   . Bipolar disorder Paternal Uncle     Social History Social History  Substance Use Topics  . Smoking status: Former Smoker    Packs/day: 1.00    Years: 7.00    Types: Cigarettes  . Smokeless tobacco: Never Used  . Alcohol use No     Allergies   Poison ivy extract; Poison oak extract; Morphine; and Silvadene [silver sulfadiazine]   Review of Systems Review of Systems  HENT: Positive for congestion, hoarse voice, rhinorrhea and sneezing.   Musculoskeletal: Positive for myalgias.  Skin: Positive for rash.  Allergic/Immunologic: Positive for environmental allergies.  All other systems reviewed and are negative.    Physical Exam Triage Vital Signs ED Triage Vitals  Enc Vitals Group     BP 11/07/16 1623 131/70     Pulse Rate 11/07/16 1623 77     Resp 11/07/16 1623 16     Temp 11/07/16 1623 98.2 F (36.8 C)      Temp Source 11/07/16 1623 Oral     SpO2 11/07/16 1623 98 %     Weight 11/07/16 1626 248 lb (112.5 kg)     Height 11/07/16 1626 5\' 4"  (1.626 m)     Head Circumference --      Peak Flow --      Pain Score 11/07/16 1630 0     Pain Loc --      Pain Edu? --      Excl. in GC? --    No data found.   Updated Vital Signs BP 131/70 (  BP Location: Right Arm)   Pulse 77   Temp 98.2 F (36.8 C) (Oral)   Resp 16   Ht 5\' 4"  (1.626 m)   Wt 248 lb (112.5 kg)   LMP 11/07/2016   SpO2 98%   BMI 42.57 kg/m   Visual Acuity Right Eye Distance:   Left Eye Distance:   Bilateral Distance:    Right Eye Near:   Left Eye Near:    Bilateral Near:     Physical Exam  Constitutional: Diane James is oriented to person, place, and time. Diane James appears well-developed and well-nourished.  HENT:  Head: Normocephalic and atraumatic.  Right Ear: External ear normal.  Left Ear: External ear normal.  Eyes: Pupils are equal, round, and reactive to light.  Neck: Normal range of motion. Neck supple.  Cardiovascular: Normal rate, regular rhythm and normal heart sounds.   Pulmonary/Chest: Effort normal and breath sounds normal.  Musculoskeletal: Normal range of motion. Diane James exhibits no edema or deformity.  Neurological: Diane James is alert and oriented to person, place, and time.  Skin: Skin is warm. Lesion and rash noted. Rash is urticarial. There is erythema.     Red rash with multiple nodularity and involving the elbow upper left arm and lower arm in a continuous red rash.  Psychiatric: Her mood appears anxious.  Vitals reviewed.    UC Treatments / Results  Labs (all labs ordered are listed, but only abnormal results are displayed) Labs Reviewed - No data to display  EKG  EKG Interpretation None       Radiology No results found.  Procedures Procedures (including critical care time)  Medications Ordered in UC Medications  methylPREDNISolone sodium succinate (SOLU-MEDROL) 125 mg/2 mL injection 125 mg  (125 mg Intramuscular Given 11/07/16 1704)  EPINEPHrine (ADRENALIN) 0.3 mg (0.3 mg Subcutaneous Given 11/07/16 1704)  famotidine (PEPCID) tablet 20 mg (20 mg Oral Given 11/07/16 1704)     Initial Impression / Assessment and Plan / UC Course  I have reviewed the triage vital signs and the nursing notes.  Pertinent labs & imaging results that were available during my care of the patient were reviewed by me and considered in my medical decision making (see chart for details).  Clinical Course     Because of the rash worsening and patient I'll see fearful of anaphylaxis reaction will give her injection epinephrine subcutaneous Solu-Medrol 125 mg IM. Her histamine choice is Allegra little take 60 mg of Allegra twice a day and also will place her on Zantac 150 twice a day will give her Pepcid 40 here. Would recommend Diane James take Zyrtec at night if Diane James needs it and will place her on 12 day course of prednisone in the decreasing dose pack since we are not sure what the causing etiology of this rash and this allergic reaction. Follow-up PCP in 3-4 days not better work note given for today and probably football as well.  Final Clinical Impressions(s) / UC Diagnoses   Final diagnoses:  Contact dermatitis, unspecified contact dermatitis type, unspecified trigger  Allergic reaction, initial encounter  Rash    New Prescriptions New Prescriptions   FEXOFENADINE (ALLEGRA) 60 MG TABLET    Take 1 tablet (60 mg total) by mouth 2 (two) times daily.   PREDNISONE (STERAPRED UNI-PAK 21 TAB) 10 MG (21) TBPK TABLET    6 tabs day 1 and 2, 5 tabs day 3 and 4, 4 tabs day 5 and 6, 3 tabs day 7 and 8, 2 tabs day 9 and  10, 1 tab day 11 and 12. Take orally   RANITIDINE (ZANTAC) 150 MG CAPSULE    Take 1 capsule (150 mg total) by mouth 2 (two) times daily.      Note: This dictation was prepared with Dragon dictation along with smaller phrase technology. Any transcriptional errors that result from this process are  unintentional.   Hassan Rowan, MD 11/07/16 1718

## 2016-11-07 NOTE — Discharge Instructions (Signed)
Since her taking the Allegra 60 mg twice a day which is an H1 blocker and you will be taking Zantac 150 twice a day an H2 blocker if you still have any trouble with itching or rash hours suggest Zyrtec over-the-counter 10 mg at night. Course take every 12 day course of prednisone as well.

## 2016-11-24 ENCOUNTER — Encounter: Payer: Self-pay | Admitting: Family Medicine

## 2016-11-24 ENCOUNTER — Ambulatory Visit (INDEPENDENT_AMBULATORY_CARE_PROVIDER_SITE_OTHER): Payer: BLUE CROSS/BLUE SHIELD | Admitting: Family Medicine

## 2016-11-24 VITALS — BP 126/82 | HR 80 | Ht 64.0 in | Wt 269.0 lb

## 2016-11-24 DIAGNOSIS — E669 Obesity, unspecified: Secondary | ICD-10-CM | POA: Diagnosis not present

## 2016-11-24 DIAGNOSIS — K219 Gastro-esophageal reflux disease without esophagitis: Secondary | ICD-10-CM | POA: Diagnosis not present

## 2016-11-24 DIAGNOSIS — R32 Unspecified urinary incontinence: Secondary | ICD-10-CM | POA: Diagnosis not present

## 2016-11-24 MED ORDER — DESMOPRESSIN ACETATE 0.2 MG PO TABS: 600.0000 ug | ORAL_TABLET | Freq: Every day | ORAL | 11 refills | Status: DC

## 2016-11-24 MED ORDER — RANITIDINE HCL 150 MG PO CAPS: 150.0000 mg | ORAL_CAPSULE | Freq: Two times a day (BID) | ORAL | 11 refills | Status: DC

## 2016-11-24 NOTE — Progress Notes (Signed)
Name: Diane James   MRN: [id number]    DOB: [birth year]   Date:11/24/2016       Progress Note  Subjective  Chief Complaint  Chief Complaint  Patient presents with  . Gastroesophageal Reflux    has gone from only taking once in a while to taking 1 every day- "seems to help, not the best of the best but some"  . Nocturnal Enuresis    takes the Desmopressin for control    Gastroesophageal Reflux  She reports no abdominal pain, no belching, no chest pain, no choking, no coughing, no dysphagia, no early satiety, no globus sensation, no heartburn, no hoarse voice, no nausea, no sore throat, no stridor, no tooth decay, no water brash or no wheezing. This is a new problem. The current episode started more than 1 year ago. The problem has been waxing and waning. The symptoms are aggravated by certain foods. Pertinent negatives include no anemia, fatigue, melena, muscle weakness, orthopnea or weight loss. Risk factors include obesity and lack of exercise. She has tried a histamine-2 antagonist for the symptoms. The treatment provided moderate relief. Past procedures do not include an abdominal ultrasound, an EGD, esophageal manometry, esophageal pH monitoring, H. pylori antibody titer or a UGI.    No problem-specific Assessment & Plan notes found for this encounter.   Past Medical History:  Diagnosis Date  . ADHD (attention deficit hyperactivity disorder)   . Anxiety   . Bipolar disorder (HCC)   . Chicken pox   . Depression   . Enuresis   . Headache   . Migraines   . Neuromuscular disorder [facility](HCC)     Past Surgical History:  Procedure Laterality Date  . DILATION AND CURETTAGE OF UTERUS    . GANGLION CYST EXCISION     left wrist x3  . OVARIAN CYST REMOVAL     right ovary    Family History  Problem Relation Age of Onset  . Fibromyalgia Mother   . Cancer Maternal Grandmother     breast cancer  . Anxiety disorder Father   . Depression Father   . ADD / ADHD Brother   . ADD  / ADHD Brother   . Bipolar disorder Paternal Uncle     Social History   Social History  . Marital status: Married    Spouse name: N/A  . Number of children: N/A  . Years of education: N/A   Occupational History  . Not on file.   Social History Main Topics  . Smoking status: Former Smoker    Packs/day: 1.00    Years: 7.00    Types: Cigarettes  . Smokeless tobacco: Never Used  . Alcohol use No  . Drug use: No  . Sexual activity: Yes    Birth control/ protection: IUD   Other Topics Concern  . Not on file   Social History Narrative  . No narrative on file    Allergies  Allergen Reactions  . Poison Ivy Extract Anaphylaxis  . Poison Oak Extract Anaphylaxis  . Morphine Hives  . Silvadene [Silver Sulfadiazine] Hives    burning     Review of Systems  Constitutional: Negative for chills, fatigue, fever, malaise/fatigue and weight loss.  HENT: Negative for ear discharge, ear pain, hoarse voice and sore throat.   Eyes: Negative for blurred vision.  Respiratory: Negative for cough, sputum production, choking, shortness of breath and wheezing.   Cardiovascular: Negative for chest pain, palpitations and leg swelling.  Gastrointestinal: Negative for abdominal pain,  blood in stool, constipation, diarrhea, dysphagia, heartburn, melena and nausea.  Genitourinary: Negative for dysuria, frequency, hematuria and urgency.  Musculoskeletal: Negative for back pain, joint pain, myalgias, muscle weakness and neck pain.  Skin: Negative for rash.  Neurological: Negative for dizziness, tingling, sensory change, focal weakness and headaches.  Endo/Heme/Allergies: Negative for environmental allergies and polydipsia. Does not bruise/bleed easily.  Psychiatric/Behavioral: Negative for depression and suicidal ideas. The patient is not nervous/anxious and does not have insomnia.      Objective  Vitals:   11/24/16 1353  BP: 126/82  Pulse: 80  Weight: 269 lb (122 kg)  Height: 5\' 4"  (1.626  m)    Physical Exam  Constitutional: She is well-developed, well-nourished, and in no distress. No distress.  HENT:  Head: Normocephalic and atraumatic.  Right Ear: External ear normal.  Left Ear: External ear normal.  Nose: Nose normal.  Mouth/Throat: Oropharynx is clear and moist.  Eyes: Conjunctivae and EOM are normal. Pupils are equal, round, and reactive to light. Right eye exhibits no discharge. Left eye exhibits no discharge.  Neck: Normal range of motion. Neck supple. No JVD present. No thyromegaly present.  Cardiovascular: Normal rate, regular rhythm, normal heart sounds and intact distal pulses.  Exam reveals no gallop and no friction rub.   No murmur heard. Pulmonary/Chest: Effort normal and breath sounds normal. She has no wheezes. She has no rales.  Abdominal: Soft. Bowel sounds are normal. She exhibits no distension and no mass. There is no tenderness. There is no rebound and no guarding.  Musculoskeletal: Normal range of motion. She exhibits no edema.  Lymphadenopathy:    She has no cervical adenopathy.  Neurological: She is alert.  Skin: Skin is warm and dry. She is not diaphoretic.  Psychiatric: Mood and affect normal.  Nursing note and vitals reviewed.     Assessment & Plan  Problem List Items Addressed This Visit      Other   Enuresis    Other Visit Diagnoses    Gastroesophageal reflux disease, esophagitis presence not specified    -  Primary   Relevant Medications   ranitidine (ZANTAC) 150 MG capsule   Obesity without serious comorbidity, unspecified classification, unspecified obesity type       suggest weight watchers        Dr. Hayden Rasmusseneanna Wava [facility]ildow [facility]  11/24/16

## 2016-11-24 NOTE — Patient Instructions (Signed)

## 2017-01-19 ENCOUNTER — Ambulatory Visit (INDEPENDENT_AMBULATORY_CARE_PROVIDER_SITE_OTHER): Payer: BLUE CROSS/BLUE SHIELD | Admitting: Family Medicine

## 2017-01-19 ENCOUNTER — Encounter: Payer: Self-pay | Admitting: Family Medicine

## 2017-01-19 VITALS — BP 130/78 | HR 100 | Temp 99.3°F | Ht 64.0 in | Wt 265.0 lb

## 2017-01-19 DIAGNOSIS — J101 Influenza due to other identified influenza virus with other respiratory manifestations: Secondary | ICD-10-CM

## 2017-01-19 DIAGNOSIS — J0140 Acute pansinusitis, unspecified: Secondary | ICD-10-CM | POA: Diagnosis not present

## 2017-01-19 LAB — POCT INFLUENZA A/B
INFLUENZA A, POC: POSITIVE — AB
Influenza B, POC: NEGATIVE

## 2017-01-19 MED ORDER — AZITHROMYCIN 250 MG PO TABS: ORAL_TABLET | ORAL | 0 refills | Status: DC

## 2017-01-19 MED ORDER — OSELTAMIVIR PHOSPHATE 75 MG PO CAPS: 75.0000 mg | ORAL_CAPSULE | Freq: Two times a day (BID) | ORAL | 0 refills | Status: DC

## 2017-01-19 NOTE — Progress Notes (Signed)
Name: Diane James   MRN: [id number]    DOB: [birth year]   Date:01/19/2017       Progress Note  Subjective  Chief Complaint  Chief Complaint  Patient presents with  . Sinusitis    facial pressure/ pain, bilateral ear pain, 101.7 fever at home.     Sinusitis  This is a new problem. The current episode started in the past 7 days. The problem has been gradually worsening since onset. Maximum temperature: low grade. The fever has been present for 1 to 2 days. The pain is moderate. Associated symptoms include congestion, ear pain, headaches and sinus pressure. Pertinent negatives include no chills, coughing, diaphoresis, hoarse voice, neck pain, shortness of breath, sneezing, sore throat or swollen glands. Past treatments include acetaminophen. The treatment provided no relief.  Fever   This is a new problem. The current episode started yesterday (low grade). The problem has been gradually worsening. The maximum temperature noted was 100 to 100.9 F. The temperature was taken using an axillary reading. Associated symptoms include congestion, ear pain and headaches. Pertinent negatives include no abdominal pain, chest pain, coughing, diarrhea, muscle aches, nausea, rash, sleepiness, sore throat, urinary pain, vomiting or wheezing. The treatment provided mild relief.  Risk factors: sick contacts     No problem-specific Assessment & Plan notes found for this encounter.   Past Medical History:  Diagnosis Date  . ADHD (attention deficit hyperactivity disorder)   . Anxiety   . Bipolar disorder (HCC)   . Chicken pox   . Depression   . Enuresis   . Headache   . Migraines   . Neuromuscular disorder [facility])     Past Surgical History:  Procedure Laterality Date  . DILATION AND CURETTAGE OF UTERUS    . GANGLION CYST EXCISION     left wrist x3  . OVARIAN CYST REMOVAL     right ovary    Family History  Problem Relation Age of Onset  . Fibromyalgia Mother   . Cancer Maternal Grandmother      breast cancer  . Anxiety disorder Father   . Depression Father   . ADD / ADHD Brother   . ADD / ADHD Brother   . Bipolar disorder Paternal Uncle     Social History   Social History  . Marital status: Married    Spouse name: N/A  . Number of children: N/A  . Years of education: N/A   Occupational History  . Not on file.   Social History Main Topics  . Smoking status: Former Smoker    Packs/day: 1.00    Years: 7.00    Types: Cigarettes  . Smokeless tobacco: Never Used  . Alcohol use No  . Drug use: No  . Sexual activity: Yes    Birth control/ protection: IUD   Other Topics Concern  . Not on file   Social History Narrative  . No narrative on file    Allergies  Allergen Reactions  . Poison Ivy Extract Anaphylaxis  . Poison Oak Extract Anaphylaxis  . Morphine Hives  . Silvadene [Silver Sulfadiazine] Hives    burning     Review of Systems  Constitutional: Positive for fever. Negative for chills, diaphoresis, malaise/fatigue and weight loss.  HENT: Positive for congestion, ear pain and sinus pressure. Negative for ear discharge, hoarse voice, sneezing and sore throat.   Eyes: Negative for blurred vision.  Respiratory: Negative for cough, sputum production, shortness of breath and wheezing.   Cardiovascular: Negative for chest  pain, palpitations and leg swelling.  Gastrointestinal: Negative for abdominal pain, blood in stool, constipation, diarrhea, heartburn, melena, nausea and vomiting.  Genitourinary: Negative for dysuria, frequency, hematuria and urgency.  Musculoskeletal: Negative for back pain, joint pain, myalgias and neck pain.  Skin: Negative for rash.  Neurological: Positive for headaches. Negative for dizziness, tingling, sensory change and focal weakness.  Endo/Heme/Allergies: Negative for environmental allergies and polydipsia. Does not bruise/bleed easily.  Psychiatric/Behavioral: Negative for depression and suicidal ideas. The patient is not  nervous/anxious and does not have insomnia.      Objective  Vitals:   01/19/17 1534  BP: 130/78  Pulse: 100  Temp: 99.3 F (37.4 C)  TempSrc: Oral  SpO2: 99%  Weight: 265 lb (120.2 kg)  Height: 5\' 4"  (1.626 m)    Physical Exam  Constitutional: She is well-developed, well-nourished, and in no distress. No distress.  HENT:  Head: Normocephalic and atraumatic.  Right Ear: External ear normal.  Left Ear: External ear normal.  Nose: Right sinus exhibits maxillary sinus tenderness and frontal sinus tenderness. Left sinus exhibits maxillary sinus tenderness and frontal sinus tenderness.  Mouth/Throat: Oropharynx is clear and moist.  Eyes: Conjunctivae and EOM are normal. Pupils are equal, round, and reactive to light. Right eye exhibits no discharge. Left eye exhibits no discharge.  Neck: Normal range of motion. Neck supple. No JVD present. No thyromegaly present.  Cardiovascular: Normal rate, regular rhythm, normal heart sounds and intact distal pulses.  Exam reveals no gallop and no friction rub.   No murmur heard. Pulmonary/Chest: Effort normal and breath sounds normal. She has no wheezes. She has no rales.  Abdominal: Soft. Bowel sounds are normal. She exhibits no mass. There is no tenderness. There is no guarding.  Musculoskeletal: Normal range of motion. She exhibits no edema.  Lymphadenopathy:    She has no cervical adenopathy.  Neurological: She is alert. She has normal reflexes.  Skin: Skin is warm and dry. She is not diaphoretic.  Psychiatric: Mood and affect normal.  Nursing note and vitals reviewed.     Assessment & Plan  Problem List Items Addressed This Visit    None    Visit Diagnoses    Influenza A    -  Primary   Relevant Medications   oseltamivir (TAMIFLU) 75 MG capsule   azithromycin (ZITHROMAX) 250 MG tablet   Other Relevant Orders   POCT Influenza A/B (Completed)   Acute non-recurrent pansinusitis       Relevant Medications   oseltamivir  (TAMIFLU) 75 MG capsule   azithromycin (ZITHROMAX) 250 MG tablet        Dr. Hayden Rasmusseneanna Jones [facility]  01/19/17

## 2017-02-03 ENCOUNTER — Ambulatory Visit (INDEPENDENT_AMBULATORY_CARE_PROVIDER_SITE_OTHER): Payer: BLUE CROSS/BLUE SHIELD | Admitting: Family Medicine

## 2017-02-03 VITALS — BP 120/70 | HR 88 | Ht 64.0 in | Wt 265.0 lb

## 2017-02-03 DIAGNOSIS — N760 Acute vaginitis: Secondary | ICD-10-CM | POA: Diagnosis not present

## 2017-02-03 LAB — POCT URINALYSIS DIPSTICK
BILIRUBIN UA: NEGATIVE
Glucose, UA: NEGATIVE
Ketones, UA: NEGATIVE
Leukocytes, UA: NEGATIVE
NITRITE UA: NEGATIVE
PH UA: 6
Protein, UA: NEGATIVE
RBC UA: NEGATIVE
SPEC GRAV UA: 1.01
UROBILINOGEN UA: 0.2

## 2017-02-03 MED ORDER — FLUCONAZOLE 150 MG PO TABS
150.0000 mg | ORAL_TABLET | Freq: Once | ORAL | 0 refills | Status: AC
Start: 2017-02-03 — End: 2017-02-03

## 2017-02-03 MED ORDER — DOXYCYCLINE HYCLATE 100 MG PO TABS: 100.0000 mg | ORAL_TABLET | Freq: Two times a day (BID) | ORAL | 0 refills | Status: DC

## 2017-02-03 NOTE — Addendum Note (Signed)
Addended by: Duanne LimerickJONES, Yalitza Teed C on: 02/03/2017 10:38 AM   Modules accepted: Orders

## 2017-02-03 NOTE — Progress Notes (Signed)
Name: Diane James   MRN: [id number]    DOB: 1986/08/17   Date:02/03/2017       Progress Note  Subjective  Chief Complaint  Chief Complaint  Patient presents with  . Vaginal Itching    s/p antibiotic and Tamiflu     Vaginal Itching  The patient's primary symptoms include genital itching, vaginal bleeding and vaginal discharge. The patient's pertinent negatives include no genital lesions, genital odor, genital rash, missed menses or pelvic pain. This is a recurrent problem. The current episode started more than 1 year ago. The problem occurs constantly. The problem has been gradually worsening. The pain is moderate. She is not pregnant. Pertinent negatives include no abdominal pain, anorexia, back pain, chills, constipation, diarrhea, discolored urine, dysuria, fever, flank pain, frequency, headaches, hematuria, joint pain, joint swelling, nausea, painful intercourse, rash, sore throat, urgency or vomiting. The vaginal discharge was normal. The vaginal bleeding is typical of menses. Nothing (recent treated with azith) aggravates the symptoms. She has tried antifungals for the symptoms. The treatment provided mild relief. No, her partner does not have an STD. Her past medical history is significant for endometriosis and vaginosis. There is no history of PID or an STD. (Vaginal vaginosis)    No problem-specific Assessment & Plan notes found for this encounter.   Past Medical History:  Diagnosis Date  . ADHD (attention deficit hyperactivity disorder)   . Anxiety   . Bipolar disorder (HCC)   . Chicken pox   . Depression   . Enuresis   . Headache   . Migraines   . Neuromuscular disorder [facility])     Past Surgical History:  Procedure Laterality Date  . DILATION AND CURETTAGE OF UTERUS    . GANGLION CYST EXCISION     left wrist x3  . OVARIAN CYST REMOVAL     right ovary    Family History  Problem Relation Age of Onset  . Fibromyalgia Mother   . Cancer Maternal Grandmother      breast cancer  . Anxiety disorder Father   . Depression Father   . ADD / ADHD Brother   . ADD / ADHD Brother   . Bipolar disorder Paternal Uncle     Social History   Social History  . Marital status: Married    Spouse name: N/A  . Number of children: N/A  . Years of education: N/A   Occupational History  . Not on file.   Social History Main Topics  . Smoking status: Former Smoker    Packs/day: 1.00    Years: 7.00    Types: Cigarettes  . Smokeless tobacco: Never Used  . Alcohol use No  . Drug use: No  . Sexual activity: Yes    Birth control/ protection: IUD   Other Topics Concern  . Not on file   Social History Narrative  . No narrative on file    Allergies  Allergen Reactions  . Poison Ivy Extract Anaphylaxis  . Poison Oak Extract Anaphylaxis  . Morphine Hives  . Silvadene [Silver Sulfadiazine] Hives    burning    Outpatient Medications Prior to Visit  Medication Sig Dispense Refill  . acetaminophen (TYLENOL) 325 MG tablet Take by mouth.    . clonazePAM (KLONOPIN) 0.5 MG tablet Reported on 04/21/2016  0  . desmopressin (DDAVP) 0.2 MG tablet Take 3 tablets (600 mcg total) by mouth at bedtime. 90 tablet 11  . fexofenadine (ALLEGRA) 60 MG tablet Take 1 tablet (60 mg total) by mouth  2 (two) times daily. 30 tablet 0  . ranitidine (ZANTAC) 150 MG capsule Take 1 capsule (150 mg total) by mouth 2 (two) times daily. 60 capsule 11  . azithromycin (ZITHROMAX) 250 MG tablet 2 today then 1 a day for 4 days 6 tablet 0  . oseltamivir (TAMIFLU) 75 MG capsule Take 1 capsule (75 mg total) by mouth 2 (two) times daily. 10 capsule 0   No facility-administered medications prior to visit.     Review of Systems  Constitutional: Negative for chills, fever, malaise/fatigue and weight loss.  HENT: Negative for ear discharge, ear pain and sore throat.   Eyes: Negative for blurred vision.  Respiratory: Negative for cough, sputum production, shortness of breath and wheezing.    Cardiovascular: Negative for chest pain, palpitations and leg swelling.  Gastrointestinal: Negative for abdominal pain, anorexia, blood in stool, constipation, diarrhea, heartburn, melena, nausea and vomiting.  Genitourinary: Positive for vaginal discharge. Negative for dysuria, flank pain, frequency, hematuria, missed menses, pelvic pain and urgency.  Musculoskeletal: Negative for back pain, joint pain, myalgias and neck pain.  Skin: Negative for rash.  Neurological: Negative for dizziness, tingling, sensory change, focal weakness and headaches.  Endo/Heme/Allergies: Negative for environmental allergies and polydipsia. Does not bruise/bleed easily.  Psychiatric/Behavioral: Negative for depression and suicidal ideas. The patient is not nervous/anxious and does not have insomnia.      Objective  Vitals:   02/03/17 1005  BP: 120/70  Pulse: 88  Weight: 265 lb (120.2 kg)  Height: 5\' 4"  (1.626 m)    Physical Exam  Constitutional: She is well-developed, well-nourished, and in no distress. No distress.  HENT:  Head: Normocephalic and atraumatic.  Right Ear: External ear normal.  Left Ear: External ear normal.  Nose: Nose normal.  Mouth/Throat: Oropharynx is clear and moist.  Eyes: Conjunctivae and EOM are normal. Pupils are equal, round, and reactive to light. Right eye exhibits no discharge. Left eye exhibits no discharge.  Neck: Normal range of motion. Neck supple. No JVD present. No thyromegaly present.  Cardiovascular: Normal rate, regular rhythm, normal heart sounds and intact distal pulses.  Exam reveals no gallop and no friction rub.   No murmur heard. Pulmonary/Chest: Effort normal and breath sounds normal.  Abdominal: Soft. Bowel sounds are normal. She exhibits no mass. There is no tenderness. There is no guarding.  Musculoskeletal: Normal range of motion. She exhibits no edema.  Lymphadenopathy:    She has no cervical adenopathy.  Neurological: She is alert. She has normal  reflexes.  Skin: Skin is warm and dry. She is not diaphoretic.  Psychiatric: Mood and affect normal.  Nursing note and vitals reviewed.     Assessment & Plan  Problem List Items Addressed This Visit    None    Visit Diagnoses    Acute vaginitis    -  Primary   suspect candidiasis   Relevant Medications   fluconazole (DIFLUCAN) 150 MG tablet   doxycycline (VIBRA-TABS) 100 MG tablet      Meds ordered this encounter  Medications  . fluconazole (DIFLUCAN) 150 MG tablet    Sig: Take 1 tablet (150 mg total) by mouth once.    Dispense:  1 tablet    Refill:  0  . doxycycline (VIBRA-TABS) 100 MG tablet    Sig: Take 1 tablet (100 mg total) by mouth 2 (two) times daily.    Dispense:  20 tablet    Refill:  0   Take 1 Diflucan now and repeat in 1  week- #2   Dr. Hayden Rasmusseneanna Siddharth [facility]  02/03/17

## 2017-04-25 ENCOUNTER — Ambulatory Visit
Admission: EM | Admit: 2017-04-25 | Discharge: 2017-04-25 | Disposition: A | Discharge status: Home / Self Care | Payer: BLUE CROSS/BLUE SHIELD | Attending: Family Medicine | Admitting: Family Medicine

## 2017-04-25 ENCOUNTER — Ambulatory Visit (INDEPENDENT_AMBULATORY_CARE_PROVIDER_SITE_OTHER): Payer: BLUE CROSS/BLUE SHIELD

## 2017-04-25 ENCOUNTER — Encounter: Payer: Self-pay | Admitting: Emergency Medicine

## 2017-04-25 DIAGNOSIS — M25562 Pain in left knee: Secondary | ICD-10-CM | POA: Diagnosis not present

## 2017-04-25 DIAGNOSIS — M79604 Pain in right leg: Secondary | ICD-10-CM

## 2017-04-25 DIAGNOSIS — M79605 Pain in left leg: Secondary | ICD-10-CM

## 2017-04-25 MED ORDER — MELOXICAM 15 MG PO TABS: 15.0000 mg | ORAL_TABLET | Freq: Every day | ORAL | 0 refills | Status: DC | PRN

## 2017-04-25 MED ORDER — OXYCODONE-ACETAMINOPHEN 5-325 MG PO TABS: 1.0000 | ORAL_TABLET | Freq: Three times a day (TID) | ORAL | 0 refills | Status: DC | PRN

## 2017-04-25 NOTE — Discharge Instructions (Signed)
Take medication as prescribed. Rest. Drink plenty of fluids.   Follow up with your primary care physician tomorrow as discussed. Follow up with orthopedic as needed for continued pain. Return to Urgent care or Emergency room for new or worsening concerns.

## 2017-04-25 NOTE — ED Notes (Signed)
Patient given prescription for crutches to get at her pharmacy.

## 2017-04-25 NOTE — ED Provider Notes (Signed)
[facility] ____________________________________________  Time seen: Approximately 9:18 AM  I have reviewed the triage vital signs and the nursing notes.   HISTORY  Chief Complaint Knee Pain (left) and Leg Swelling  HPI Diane James is a 31 y.o. female  presenting for evaluation of left knee pain. Patient reports pain is in present for the last 3 weeks. Reports initially noted the pain after she had tripped over a nail falling forward. Patient reports that she caught herself and did not fall directly on her left knee but states she believes she twisted her knee causing the pain. Patient reports the pain has continued to lateral to anterior left knee over the last 3 weeks and reports yesterday pain increased. Patient states yesterday she began noticing swelling that felt it was from her thigh all the way down to her ankle. Denies any other fall or injury. Patient reports the pain in the other areas is described as swelling tight pain but that "real pain "is in her left knee.  Patient reports pain is moderate at this time and worse with direct palpation and movement. Patient reports she has been taking over-the-counter ibuprofen and Tylenol which had been helping until yesterday. Reports has continued to remain ambulatory and states active, including continuing working. Denies any paresthesias or decreased range of motion. Denies fevers, insect bite, tick bite, rash, color changes.  Denies chest pain, shortness of breath, abdominal pain, dysuria, extremity pain, extremity swelling or rash. Denies any previous PE or DVT. Denies hemoptysis, recent surgery, recent trips or immobilization. Denies recent sickness. Denies recent antibiotic use. States not smoker. States not on birth control. Denies pregnancy.  Patient's last menstrual period was 04/13/2017 (exact date). Denies chance of pregnancy. Duanne Limerick, MD: PCP   Past Medical History:  Diagnosis Date  . ADHD  (attention deficit hyperactivity disorder)   . Anxiety   . Bipolar disorder (HCC)   . Chicken pox   . Depression   . Enuresis   . Headache   . Migraines   . Neuromuscular disorder [facility])     Patient Active Problem List   Diagnosis Date Noted  . Obesity, Class III, BMI 40-49.9 (morbid obesity) (HCC) 03/30/2016  . Enuresis 03/30/2016  . Allergic rhinitis 03/30/2016  . ADD (attention deficit disorder) 03/30/2016  . Obesity affecting pregnancy, antepartum 07/09/2015  . High-risk pregnancy 09/02/2014  . Other specified abnormal immunological findings in serum 11/14/2013  . Anxiety and depression 03/14/2013  . Bipolar I disorder (HCC) 03/14/2013  . Lumbar strain 04/30/2012    Past Surgical History:  Procedure Laterality Date  . DILATION AND CURETTAGE OF UTERUS    . GANGLION CYST EXCISION     left wrist x3  . OVARIAN CYST REMOVAL     right ovary     No current facility-administered medications for this encounter.   Current Outpatient Prescriptions:  .  methylphenidate (RITALIN) 10 MG tablet, Take 30 mg by mouth at bedtime., Disp: , Rfl:  .  acetaminophen (TYLENOL) 325 MG tablet, Take by mouth., Disp: , Rfl:  .  clonazePAM (KLONOPIN) 0.5 MG tablet, Reported on 04/21/2016, Disp: , Rfl: 0 .  desmopressin (DDAVP) 0.2 MG tablet, Take 3 tablets (600 mcg total) by mouth at bedtime., Disp: 90 tablet, Rfl: 11 .  fexofenadine (ALLEGRA) 60 MG tablet, Take 1 tablet (60 mg total) by mouth 2 (two) times daily., Disp: 30 tablet, Rfl: 0 .  meloxicam (MOBIC) 15 MG tablet, Take 1 tablet (15 mg total) by mouth  daily as needed., Disp: 10 tablet, Rfl: 0 .  oxyCODONE-acetaminophen (ROXICET) 5-325 MG tablet, Take 1 tablet by mouth every 8 (eight) hours as needed for moderate pain or severe pain (Do not drive or operate heavy machinery while taking as can cause drowsiness.)., Disp: 6 tablet, Rfl: 0 .  ranitidine (ZANTAC) 150 MG capsule, Take 1 capsule (150 mg total) by mouth 2 (two) times daily., Disp:  60 capsule, Rfl: 11  Allergies Poison ivy extract; Poison oak extract; Morphine; and Silvadene [silver sulfadiazine]  Family History  Problem Relation Age of Onset  . Fibromyalgia Mother   . Cancer Maternal Grandmother        breast cancer  . Anxiety disorder Father   . Depression Father   . ADD / ADHD Brother   . ADD / ADHD Brother   . Bipolar disorder Paternal Uncle   States no family history of PE, DVT or clotting disorders.  Social History Social History  Substance Use Topics  . Smoking status: Former Smoker    Packs/day: 1.00    Years: 7.00    Types: Cigarettes  . Smokeless tobacco: Never Used  . Alcohol use No    Review of Systems Constitutional: No fever/chills Eyes: No visual changes. ENT: No sore throat. Cardiovascular: Denies chest pain. Respiratory: Denies shortness of breath. Gastrointestinal: No abdominal pain.  No nausea, no vomiting.  No diarrhea.  No constipation. Genitourinary: Negative for dysuria. Musculoskeletal: Negative for back pain.As above. Skin: Negative for rash.  ____________________________________________   PHYSICAL EXAM:  VITAL SIGNS: ED Triage Vitals  Enc Vitals Group     BP 04/25/17 0845 124/70     Pulse Rate 04/25/17 0845 81     Resp 04/25/17 0845 16     Temp 04/25/17 0845 98.1 F (36.7 C)     Temp Source 04/25/17 0845 Oral     SpO2 04/25/17 0845 99 %     Weight 04/25/17 0843 270 lb (122.5 kg)     Height 04/25/17 0843 5\' 4"  (1.626 m)     Head Circumference --      Peak Flow --      Pain Score 04/25/17 0843 8     Pain Loc --      Pain Edu? --      Excl. in GC? --     Constitutional: Alert and oriented. Well appearing and in no acute distress. ENT      Head: Normocephalic and atraumatic. Cardiovascular: Normal rate, regular rhythm. Grossly normal heart sounds.  Good peripheral circulation. Respiratory: Normal respiratory effort without tachypnea nor retractions. Breath sounds are clear and equal bilaterally. No  wheezes, rales, rhonchi. Gastrointestinal: Soft and nontender. No CVA tenderness. Musculoskeletal:   No midline cervical, thoracic or lumbar tenderness to palpation. Except: Left lateral posterior, left lateral knee and left anterior knee moderate tenderness to direct palpation with mild swelling, no erythema, no warmth to palpation, pain with anterior drawer test, no pain with posterior drawer test, pain with medial and lateral stress, mild pain with resisted knee extension, no pain with resisted knee flexion, full range of motion present. Left leg from left mid thigh to left ankle with mild diffuse tenderness and minimal edema noted, no pitting edema noted, no point bony tenderness and otherwise full range of motion present. Bilateral feet distal toes normal sensation and capillary refill. Bilateral posterior tibialis and dorsalis pedis pulses equal and easily palpated. No rash noted. Ambulatory with mild antalgic gait.  Neurologic:  Normal speech and language.  Speech is normal. No gait instability.  Skin:  Skin is warm, dry and intact. No rash noted. Psychiatric: Mood and affect are normal. Speech and behavior are normal. Patient exhibits appropriate insight and judgment   ___________________________________________   LABS (all labs ordered are listed, but only abnormal results are displayed)  Labs Reviewed - No data to display  RADIOLOGY  Dg Knee Complete 4 Views Left  Result Date: 04/25/2017 CLINICAL DATA:  Pt states she twisted knee 4 weeks ago to keep from falling after almost tripping on nail head sticking up out of floor. Most pain post knee and whole leg is tender with new swelling in lower leg and ankle since yesterday. EXAM: LEFT KNEE - COMPLETE 4+ VIEW COMPARISON:  None. FINDINGS: No fracture of the proximal tibia or distal femur. Patella is normal. No joint effusion. Inferolateral to the medial tibial metaphysis are cluster around bodies with peripheral calcification measuring 5 to 7  mm. IMPRESSION: 1. No acute or fracture dislocation. 2. Cluster round bodies medial to the proximal tibia are favored to relate to remote trauma. Electronically Signed   By: Genevive BiStewart  Edmunds M.D.   On: 04/25/2017 10:16   ____________________________________________   PROCEDURES Procedures    INITIAL IMPRESSION / ASSESSMENT AND PLAN / ED COURSE  Pertinent labs & imaging results that were available during my care of the patient were reviewed by me and considered in my medical decision making (see chart for details).  Well-appearing patient. No acute distress. Presents for evaluation of left knee and left leg pain. Patient reports left knee pain started after twisting injury when she tripped. Reports gradual onset of swelling and other pain yesterday. Discussed with patient encouraged left knee x-ray as well as left lower extremity ultrasound to exclude and evaluate for deep venous thrombosis. No ultrasound ability at this facility at this time. Will evaluate x-ray. Discussed the patient left knee x-ray that was the initial pain, patient also had expressed ankle pain bite as she states that that was not there initially declines ankle x-ray. Counseled regarding with patient in order outpatient, patient states that she is tired as she worked last night and doesn't know if she will go and get the ultrasound done. Discussed risk including death regarding presence of DVT without evaluation, patient verbalizes risks and states she will decide if she can after xray.   Left knee x-ray per radiologist no acute or fracture dislocation, cluster round bodies medial to the proximal tibia are favored to relate to remote trauma. Discussed x-ray findings with patient in rediscussed indication for ultrasound. Patient states that she is unable to have ultrasound performed today even at a later time today as she is tired and she has to work again [employer]. Patient states that she understands the risk and declines  ultrasound. Patient states that she will follow-up with her primary care physician tomorrow and have ultrasound outpatient if needed. Will treat patient with oral and daily Mobic and as needed Percocet for breakthrough pain, patient reports she is tolerated Percocet while in the past. Knee immobilizer and crutches given. Encouraged rest, ice and support. Patient declines need for work note and states she has to work regardless [employer]. Discussed with patient to follow-up closely with primary care physician as well as orthopedist for continued pain. Discussed with patient against medical advice for deferring ultrasound, and patient left ama.   [place] controlled substance [place] reviewed for last one year, most recent controlled medication are METHYLPHENIDATE 04/15/17, no other medications documented.  Discussed follow up with Primary care physician this week. Discussed follow up and return parameters including no resolution or any worsening concerns. Patient verbalized understanding and agreed to plan.   ____________________________________________   FINAL CLINICAL IMPRESSION(S) / ED DIAGNOSES  Final diagnoses:  Acute pain of left knee  Left leg pain     Discharge Medication List as of 04/25/2017 10:36 AM    START taking these medications   Details  meloxicam (MOBIC) 15 MG tablet Take 1 tablet (15 mg total) by mouth daily as needed., Starting Tue 04/25/2017, Normal    oxyCODONE-acetaminophen (ROXICET) 5-325 MG tablet Take 1 tablet by mouth every 8 (eight) hours as needed for moderate pain or severe pain (Do not drive or operate heavy machinery while taking as can cause drowsiness.)., Starting Tue 04/25/2017, Print        Note: This dictation was prepared with Dragon dictation along with smaller phrase technology. Any transcriptional errors that result from this process are unintentional.         Renford Dills, NP 04/25/17 1104

## 2017-04-25 NOTE — ED Triage Notes (Signed)
Patient states that she injured her left knee about 3 weeks ago.  Patient c/o pain in her left knee and swelling in her left leg.

## 2017-05-29 ENCOUNTER — Ambulatory Visit: Payer: Self-pay | Admitting: Family Medicine

## 2017-08-04 ENCOUNTER — Ambulatory Visit (INDEPENDENT_AMBULATORY_CARE_PROVIDER_SITE_OTHER): Payer: BLUE CROSS/BLUE SHIELD | Admitting: Family Medicine

## 2017-08-31 ENCOUNTER — Ambulatory Visit: Payer: Self-pay | Admitting: Family Medicine

## 2017-11-28 ENCOUNTER — Other Ambulatory Visit: Payer: Self-pay | Admitting: Family Medicine

## 2017-12-25 ENCOUNTER — Other Ambulatory Visit: Payer: Self-pay

## 2018-01-12 ENCOUNTER — Encounter: Payer: Self-pay | Admitting: Family Medicine

## 2018-01-12 ENCOUNTER — Ambulatory Visit: Payer: 59 | Admitting: Family Medicine

## 2018-01-12 VITALS — BP 126/80 | HR 80 | Ht 64.0 in | Wt 259.0 lb

## 2018-01-12 DIAGNOSIS — Z789 Other specified health status: Secondary | ICD-10-CM

## 2018-01-12 DIAGNOSIS — L732 Hidradenitis suppurativa: Secondary | ICD-10-CM | POA: Diagnosis not present

## 2018-01-12 DIAGNOSIS — Z124 Encounter for screening for malignant neoplasm of cervix: Secondary | ICD-10-CM | POA: Diagnosis not present

## 2018-01-12 DIAGNOSIS — R32 Unspecified urinary incontinence: Secondary | ICD-10-CM

## 2018-01-12 MED ORDER — DESMOPRESSIN ACETATE 0.2 MG PO TABS: 600.0000 ug | ORAL_TABLET | Freq: Every day | ORAL | 3 refills | Status: DC

## 2018-01-12 NOTE — Progress Notes (Signed)
Name: Diane James   MRN: [id number]    DOB: 1986/09/21   Date:01/12/2018       Progress Note  Subjective  Chief Complaint  Chief Complaint  Patient presents with  . Medication Refill    refill Desmopressin  . Labs Only    MMR Titer for school    Medication Refill  This is a chronic problem. The current episode started more than 1 year ago. The problem occurs daily. The problem has been unchanged (controlled). Associated symptoms include a rash. Pertinent negatives include no abdominal pain, chest pain, chills, coughing, fever, headaches, myalgias, nausea, neck pain or sore throat. She has tried acetaminophen for the symptoms. The treatment provided moderate relief.  Rash  This is a new problem. The current episode started more than 1 year ago. The problem has been gradually worsening since onset. The rash is diffuse. Rash characteristics: acniaform. Pertinent negatives include no cough, diarrhea, fever, joint pain, shortness of breath or sore throat. Past treatments include nothing.    No problem-specific Assessment & Plan notes found for this encounter.   Past Medical History:  Diagnosis Date  . ADHD (attention deficit hyperactivity disorder)   . Anxiety   . Bipolar disorder ([facility])   . Chicken pox   . Depression   . Enuresis   . Headache   . Migraines   . Neuromuscular disorder [facility])     Past Surgical History:  Procedure Laterality Date  . DILATION AND CURETTAGE OF UTERUS    . GANGLION CYST EXCISION     left wrist x3  . OVARIAN CYST REMOVAL     right ovary    Family History  Problem Relation Age of Onset  . Fibromyalgia Mother   . Cancer Maternal Grandmother        breast cancer  . Anxiety disorder Father   . Depression Father   . ADD / ADHD Brother   . ADD / ADHD Brother   . Bipolar disorder Paternal Uncle     Social History   Socioeconomic History  . Marital status: Married    Spouse name: Not on file  . Number of children: Not on file  . Years  of education: Not on file  . Highest education level: Not on file  Social Needs  . Financial resource strain: Not on file  . Food insecurity - worry: Not on file  . Food insecurity - inability: Not on file  . Transportation needs - medical: Not on file  . Transportation needs - non-medical: Not on file  Occupational History  . Not on file  Tobacco Use  . Smoking status: Former Smoker    Packs/day: 1.00    Years: 7.00    Pack years: 7.00    Types: Cigarettes  . Smokeless tobacco: Never Used  Substance and Sexual Activity  . Alcohol use: No  . Drug use: No  . Sexual activity: Yes  Other Topics Concern  . Not on file  Social History Narrative  . Not on file    Allergies  Allergen Reactions  . Poison Ivy Extract Anaphylaxis  . Poison Oak Extract Anaphylaxis  . Morphine Hives  . Silvadene [Silver Sulfadiazine] Hives    burning    Outpatient Medications Prior to Visit  Medication Sig Dispense Refill  . acetaminophen (TYLENOL) 325 MG tablet Take by mouth.    . desmopressin (DDAVP) 0.2 MG tablet TAKE 3 TABLETS (600 MCG TOTAL) BY MOUTH AT BEDTIME. 90 tablet 0  . clonazePAM (  KLONOPIN) 0.5 MG tablet Reported on 04/21/2016  0  . fexofenadine (ALLEGRA) 60 MG tablet Take 1 tablet (60 mg total) by mouth 2 (two) times daily. 30 tablet 0  . meloxicam (MOBIC) 15 MG tablet Take 1 tablet (15 mg total) by mouth daily as needed. 10 tablet 0  . methylphenidate (RITALIN) 10 MG tablet Take 30 mg by mouth at bedtime.    Marland Kitchen oxyCODONE-acetaminophen (ROXICET) 5-325 MG tablet Take 1 tablet by mouth every 8 (eight) hours as needed for moderate pain or severe pain (Do not drive or operate heavy machinery while taking as can cause drowsiness.). 6 tablet 0  . ranitidine (ZANTAC) 150 MG capsule Take 1 capsule (150 mg total) by mouth 2 (two) times daily. 60 capsule 11   No facility-administered medications prior to visit.     Review of Systems  Constitutional: Negative for chills, fever, malaise/fatigue  and weight loss.  HENT: Negative for ear discharge, ear pain and sore throat.   Eyes: Negative for blurred vision.  Respiratory: Negative for cough, sputum production, shortness of breath and wheezing.   Cardiovascular: Negative for chest pain, palpitations and leg swelling.  Gastrointestinal: Negative for abdominal pain, blood in stool, constipation, diarrhea, heartburn, melena and nausea.  Genitourinary: Negative for dysuria, frequency, hematuria and urgency.  Musculoskeletal: Negative for back pain, joint pain, myalgias and neck pain.  Skin: Positive for rash.  Neurological: Negative for dizziness, tingling, sensory change, focal weakness and headaches.  Endo/Heme/Allergies: Negative for environmental allergies and polydipsia. Does not bruise/bleed easily.  Psychiatric/Behavioral: Negative for depression and suicidal ideas. The patient is not nervous/anxious and does not have insomnia.      Objective  Vitals:   01/12/18 1105  BP: 126/80  Pulse: 80  Weight: 259 lb (117.5 kg)  Height: 5' 4"  (1.626 m)    Physical Exam  Constitutional: She is well-developed, well-nourished, and in no distress. No distress.  HENT:  Head: Normocephalic and atraumatic.  Right Ear: External ear normal.  Left Ear: External ear normal.  Nose: Nose normal.  Mouth/Throat: Oropharynx is clear and moist.  Eyes: Conjunctivae and EOM are normal. Pupils are equal, round, and reactive to light. Right eye exhibits no discharge. Left eye exhibits no discharge.  Neck: Normal range of motion. Neck supple. No JVD present. No thyromegaly present.  Cardiovascular: Normal rate, regular rhythm, normal heart sounds and intact distal pulses. Exam reveals no gallop and no friction rub.  No murmur heard. Pulmonary/Chest: Effort normal and breath sounds normal. She has no wheezes. She has no rales.  Abdominal: Soft. Bowel sounds are normal. She exhibits no mass. There is no tenderness. There is no guarding.   Musculoskeletal: Normal range of motion. She exhibits no edema.  Lymphadenopathy:    She has no cervical adenopathy.  Neurological: She is alert.  Skin: Skin is warm and dry. Rash noted. She is not diaphoretic.  Psychiatric: Mood and affect normal.  Nursing note and vitals reviewed.     Assessment & Plan  Problem List Items Addressed This Visit      Other   Obesity, Class III, BMI 40-49.9 (morbid obesity) (HCC)   Enuresis - Primary   Relevant Medications   desmopressin (DDAVP) 0.2 MG tablet    Other Visit Diagnoses    Hidradenitis       Relevant Orders   Ambulatory referral to Dermatology   Screening for cervical cancer       Relevant Orders   Ambulatory referral to Gynecology   History of  measles, mumps, rubella (MMR) vaccination unknown       Relevant Orders   Measles/Mumps/Rubella Immunity      Meds ordered this encounter  Medications  . desmopressin (DDAVP) 0.2 MG tablet    Sig: Take 3 tablets (600 mcg total) by mouth at bedtime.    Dispense:  90 tablet    Refill:  3    Needs appt      Dr. Otilio Miu [facility]  01/12/18

## 2018-01-13 LAB — MEASLES/MUMPS/RUBELLA IMMUNITY
MUMPS ABS, IGG: 62.9 AU/mL (ref >10.9)
RUBEOLA AB, IGG: 249 AU/mL (ref >29.9)
Rubella Antibodies, IGG: 2.42 index (ref >0.99)

## 2018-01-19 ENCOUNTER — Encounter: Payer: Self-pay | Admitting: Obstetrics and Gynecology

## 2018-02-09 ENCOUNTER — Encounter: Payer: Self-pay | Admitting: Obstetrics and Gynecology

## 2018-06-23 ENCOUNTER — Other Ambulatory Visit: Payer: Self-pay | Admitting: Family Medicine

## 2018-06-23 DIAGNOSIS — R32 Unspecified urinary incontinence: Secondary | ICD-10-CM

## 2019-10-08 ENCOUNTER — Other Ambulatory Visit: Payer: Self-pay

## 2019-10-08 DIAGNOSIS — Z20822 Contact with and (suspected) exposure to covid-19: Secondary | ICD-10-CM

## 2019-10-09 LAB — NOVEL CORONAVIRUS, NAA: SARS-CoV-2, NAA: NOT DETECTED

## 2019-11-28 ENCOUNTER — Other Ambulatory Visit: Payer: Self-pay

## 2019-11-28 ENCOUNTER — Ambulatory Visit: Payer: Self-pay | Admitting: Physician Assistant

## 2019-11-28 DIAGNOSIS — Z113 Encounter for screening for infections with a predominantly sexual mode of transmission: Secondary | ICD-10-CM

## 2019-11-29 ENCOUNTER — Encounter: Payer: Self-pay | Admitting: Physician Assistant

## 2019-11-29 LAB — WET PREP FOR TRICH, YEAST, CLUE
Trichomonas Exam: NEGATIVE
Yeast Exam: NEGATIVE

## 2019-11-29 NOTE — Progress Notes (Signed)
[facility] Department STI clinic/screening visit  Subjective:  Diane James is a 33 y.o. female being seen today for an STI screening visit. The patient reports they do not have symptoms.  Patient reports that they do not desire a pregnancy in the next year.   They reported they are not interested in discussing contraception today.  No LMP recorded.   Patient has the following medical conditions:   Patient Active Problem List   Diagnosis Date Noted  . Obesity, Class III, BMI 40-49.9 (morbid obesity) (HCC) 03/30/2016  . Enuresis 03/30/2016  . Allergic rhinitis 03/30/2016  . ADD (attention deficit disorder) 03/30/2016  . Obesity affecting pregnancy, antepartum 07/09/2015  . High-risk pregnancy 09/02/2014  . Other specified abnormal immunological findings in serum 11/14/2013  . Anxiety and depression 03/14/2013  . Bipolar I disorder (HCC) 03/14/2013  . Lumbar strain 04/30/2012    Chief Complaint  Patient presents with  . SEXUALLY TRANSMITTED DISEASE    HPI  Patient reports that she does not have any symptoms but wants a screening today.  Reports that she had a bilateral salpingectomy last year due to ectopic pregnancy.  LMP 11/15/2019 and only spotting.  Declines any hormonal BCM due to surgery.  See flowsheet for further details and programmatic requirements.    The following portions of the patient's history were reviewed and updated as appropriate: allergies, current medications, past medical history, past social history, past surgical history and problem list.  Objective:  There were no vitals filed for this visit.  Physical Exam Constitutional:      General: She is not in acute distress.    Appearance: Normal appearance. She is obese.  HENT:     Head: Normocephalic and atraumatic.     Comments: No nits, lice, or hair loss. No cervical, supraclavicular or axillary adenopathy.    Mouth/Throat:     Mouth: Mucous membranes are moist.     Pharynx:  Oropharynx is clear. No oropharyngeal exudate or posterior oropharyngeal erythema.  Eyes:     Conjunctiva/sclera: Conjunctivae normal.  Pulmonary:     Effort: Pulmonary effort is normal.  Abdominal:     Palpations: Abdomen is soft. There is no mass.     Tenderness: There is no abdominal tenderness. There is no guarding or rebound.  Genitourinary:    General: Normal vulva.     Rectum: Normal.     Comments: External genitalia/pubic area without nits, lice, edema, erythema, lesions and inguinal adenopathy. Vagina with normal mucosa and discharge. Cervix without visible lesions. Uterus firm, mobile, nt, no masses, no CMT, no adnexal tenderness or fullness. Musculoskeletal:     Cervical back: Neck supple. No tenderness.  Skin:    General: Skin is warm and dry.     Findings: No bruising, erythema, lesion or rash.  Neurological:     Mental Status: She is alert and oriented to person, place, and time.  Psychiatric:        Mood and Affect: Mood normal.        Behavior: Behavior normal.        Thought Content: Thought content normal.        Judgment: Judgment normal.      Assessment and Plan:  Diane James is a 33 y.o. female presenting to the [facility] Department for STI screening  1. Screening for STD (sexually transmitted disease) Patient into clinic without symptoms. Rec condoms with all sex. Await test results.  Counseled that RN will call if needs  to RTC for any treatment once results are back.  - WET PREP FOR TRICH, YEAST, CLUE - Gonococcus culture - [facility] - Syphilis Serology, [facility]     No follow-ups on file.  No future appointments.  Jerene Dilling, PA

## 2019-12-03 ENCOUNTER — Other Ambulatory Visit: Payer: Self-pay

## 2019-12-03 ENCOUNTER — Emergency Department: Payer: Self-pay

## 2019-12-03 ENCOUNTER — Encounter: Payer: Self-pay | Admitting: Emergency Medicine

## 2019-12-03 ENCOUNTER — Emergency Department
Admission: EM | Admit: 2019-12-03 | Discharge: 2019-12-03 | Disposition: A | Discharge status: Home / Self Care | Payer: Self-pay | Attending: Student in an Organized Health Care Education/Training Program | Admitting: Student in an Organized Health Care Education/Training Program

## 2019-12-03 DIAGNOSIS — R1013 Epigastric pain: Secondary | ICD-10-CM | POA: Insufficient documentation

## 2019-12-03 DIAGNOSIS — R110 Nausea: Secondary | ICD-10-CM | POA: Insufficient documentation

## 2019-12-03 DIAGNOSIS — F17210 Nicotine dependence, cigarettes, uncomplicated: Secondary | ICD-10-CM | POA: Insufficient documentation

## 2019-12-03 LAB — CBC
HCT: 42.2 % (ref 36.0–46.0)
Hemoglobin: 15 g/dL (ref 12.0–15.0)
MCH: 32.3 pg (ref 26.0–34.0)
MCHC: 35.5 g/dL (ref 30.0–36.0)
MCV: 90.8 fL (ref 80.0–100.0)
Platelets: 308 10*3/uL (ref 150–400)
RBC: 4.65 MIL/uL (ref 3.87–5.11)
RDW: 12.8 % (ref 11.5–15.5)
WBC: 9.7 10*3/uL (ref 4.0–10.5)
nRBC: 0 % (ref 0.0–0.2)

## 2019-12-03 LAB — COMPREHENSIVE METABOLIC PANEL
ALT: 17 U/L (ref 0–44)
AST: 18 U/L (ref 15–41)
Albumin: 4.1 g/dL (ref 3.5–5.0)
Alkaline Phosphatase: 73 U/L (ref 38–126)
Anion gap: 8 (ref 5–15)
BUN: 12 mg/dL (ref 6–20)
CO2: 25 mmol/L (ref 22–32)
Calcium: 9 mg/dL (ref 8.9–10.3)
Chloride: 106 mmol/L (ref 98–111)
Creatinine, Ser: 0.55 mg/dL (ref 0.44–1.00)
GFR calc Af Amer: >60 mL/min (ref >60)
GFR calc non Af Amer: >60 mL/min (ref >60)
Glucose, Bld: 91 mg/dL (ref 70–99)
Potassium: 4.2 mmol/L (ref 3.5–5.1)
Sodium: 139 mmol/L (ref 135–145)
Total Bilirubin: 0.4 mg/dL (ref 0.3–1.2)
Total Protein: 7.4 g/dL (ref 6.5–8.1)

## 2019-12-03 LAB — LIPASE, BLOOD: Lipase: 31 U/L (ref 11–51)

## 2019-12-03 LAB — URINALYSIS, COMPLETE (UACMP) WITH MICROSCOPIC
Bacteria, UA: NONE SEEN
Bilirubin Urine: NEGATIVE
Glucose, UA: NEGATIVE mg/dL
Hgb urine dipstick: NEGATIVE
Ketones, ur: NEGATIVE mg/dL
Leukocytes,Ua: NEGATIVE
Nitrite: NEGATIVE
Protein, ur: NEGATIVE mg/dL
Specific Gravity, Urine: 1.023 (ref 1.005–1.030)
pH: 6 (ref 5.0–8.0)

## 2019-12-03 LAB — GONOCOCCUS CULTURE

## 2019-12-03 LAB — POCT PREGNANCY, URINE: Preg Test, Ur: NEGATIVE

## 2019-12-03 MED ORDER — PANTOPRAZOLE SODIUM 40 MG PO TBEC
40.0000 mg | DELAYED_RELEASE_TABLET | Freq: Once | ORAL | Status: AC
  Administered 2019-12-03: 40 mg via ORAL
  Filled 2019-12-03: qty 1

## 2019-12-03 MED ORDER — HYDROCODONE-ACETAMINOPHEN 5-325 MG PO TABS: 1.0000 | ORAL_TABLET | ORAL | 0 refills | Status: DC | PRN

## 2019-12-03 MED ORDER — HYDROCODONE-ACETAMINOPHEN 5-325 MG PO TABS
2.0000 | ORAL_TABLET | Freq: Once | ORAL | Status: AC
Start: 2019-12-03 — End: 2019-12-03
  Administered 2019-12-03: 2 via ORAL
  Filled 2019-12-03: qty 2

## 2019-12-03 MED ORDER — PANTOPRAZOLE SODIUM 20 MG PO TBEC: 20.0000 mg | DELAYED_RELEASE_TABLET | Freq: Every day | ORAL | 1 refills | Status: DC

## 2019-12-03 MED ORDER — SODIUM CHLORIDE 0.9% FLUSH: 3.0000 mL | Freq: Once | INTRAVENOUS | Status: DC

## 2019-12-03 NOTE — Discharge Instructions (Addendum)

## 2019-12-03 NOTE — ED Provider Notes (Signed)
[facility] Emergency Department Provider Note    First MD Initiated Contact with Patient 12/03/19 1621     (approximate)  I have reviewed the triage vital signs and the nursing notes.   HISTORY  Chief Complaint Abdominal Pain    HPI Diane James is a 33 y.o. female with persistent epigastric pain status post recent evaluation at [facility] diagnosed pancreatitis and discharged home she was tolerating oral hydration.  This has had persistent discomfort.  Also associated nausea.  No fevers.  States is unrelated to alcohol.  Does not smoke.  Denies any lower abdominal pain at this time.  Denies any pelvic discomfort or discharge.  Worsened or improved with eating.  No chest pain or shortness of breath.     CT and TVUS from 12/18 [facility] reviewed  Past Medical History:  Diagnosis Date  . ADHD (attention deficit hyperactivity disorder)   . Anxiety   . Bipolar disorder ([place])   . Chicken pox   . Depression   . Enuresis   . Headache   . Migraines   . Neuromuscular disorder ([place])    Family History  Problem Relation Age of Onset  . Fibromyalgia Mother   . Cancer Maternal Grandmother        breast cancer  . Anxiety disorder Father   . Depression Father   . ADD / ADHD Brother   . ADD / ADHD Brother   . Bipolar disorder Paternal Uncle    Past Surgical History:  Procedure Laterality Date  . DILATION AND CURETTAGE OF UTERUS    . GANGLION CYST EXCISION     left wrist x3  . OVARIAN CYST REMOVAL     right ovary   Patient Active Problem List   Diagnosis Date Noted  . Obesity, Class III, BMI 40-49.9 (morbid obesity) ([place]) 03/30/2016  . Enuresis 03/30/2016  . Allergic rhinitis 03/30/2016  . ADD (attention deficit disorder) 03/30/2016  . Other specified abnormal immunological findings in serum 11/14/2013  . Anxiety and depression 03/14/2013  . Bipolar I disorder ([place]) 03/14/2013  . Lumbar strain 04/30/2012      Prior to Admission  medications   Medication Sig Start Date End Date Taking? Authorizing Provider  acetaminophen (TYLENOL) 325 MG tablet Take by mouth.    [provider]  desmopressin (DDAVP) 0.2 MG tablet TAKE 3 TABLETS (600 MCG TOTAL) BY MOUTH AT BEDTIME. 06/24/18   Juline Patch, MD  HYDROcodone-acetaminophen (NORCO) 5-325 MG tablet Take 1 tablet by mouth every 4 (four) hours as needed for moderate pain. 12/03/19   Merlyn Lot, MD  pantoprazole (PROTONIX) 20 MG tablet Take 1 tablet (20 mg total) by mouth daily. 12/03/19 12/02/20  Merlyn Lot, MD    Allergies Poison ivy extract, Poison oak extract, Morphine, and Silvadene [silver sulfadiazine]    Social History Social History   Tobacco Use  . Smoking status: Current Every Day Smoker    Packs/day: 1.00    Years: 7.00    Pack years: 7.00    Types: Cigarettes  . Smokeless tobacco: Never Used  Substance Use Topics  . Alcohol use: No  . Drug use: No    Review of Systems Patient denies headaches, rhinorrhea, blurry vision, numbness, shortness of breath, chest pain, edema, cough, abdominal pain, nausea, vomiting, diarrhea, dysuria, fevers, rashes or hallucinations unless otherwise stated above in HPI. ____________________________________________   PHYSICAL EXAM:  VITAL SIGNS: Vitals:   12/03/19 1411  BP: 109/66  Pulse: 88  Resp:  16  Temp: 98.3 F (36.8 C)  SpO2: 99%    Constitutional: Alert and oriented.  Eyes: Conjunctivae are normal.  Head: Atraumatic. Nose: No congestion/rhinnorhea. Mouth/Throat: Mucous membranes are moist.   Neck: No stridor. Painless ROM.  Cardiovascular: Normal rate, regular rhythm. Grossly normal heart sounds.  Good peripheral circulation. Respiratory: Normal respiratory effort.  No retractions. Lungs CTAB. Gastrointestinal: Soft, obese,  Mild epigastric ttp. No distention. No abdominal bruits. No CVA tenderness. Genitourinary:  Musculoskeletal: No lower extremity tenderness nor edema.  No  joint effusions. Neurologic:  Normal speech and language. No gross focal neurologic deficits are appreciated. No facial droop Skin:  Skin is warm, dry and intact. No rash noted. Psychiatric: Mood and affect are normal. Speech and behavior are normal.  ____________________________________________   LABS (all labs ordered are listed, but only abnormal results are displayed)  Results for orders placed or performed during the hospital encounter of 12/03/19 (from the past 24 hour(s))  Lipase, blood     Status: None   Collection Time: 12/03/19  2:17 PM  Result Value Ref Range   Lipase 31 11 - 51 U/L  Comprehensive metabolic panel     Status: None   Collection Time: 12/03/19  2:17 PM  Result Value Ref Range   Sodium 139 135 - 145 mmol/L   Potassium 4.2 3.5 - 5.1 mmol/L   Chloride 106 98 - 111 mmol/L   CO2 25 22 - 32 mmol/L   Glucose, Bld 91 70 - 99 mg/dL   BUN 12 6 - 20 mg/dL   Creatinine, Ser 0.01 0.44 - 1.00 mg/dL   Calcium 9.0 8.9 - 74.9 mg/dL   Total Protein 7.4 6.5 - 8.1 g/dL   Albumin 4.1 3.5 - 5.0 g/dL   AST 18 15 - 41 U/L   ALT 17 0 - 44 U/L   Alkaline Phosphatase 73 38 - 126 U/L   Total Bilirubin 0.4 0.3 - 1.2 mg/dL   GFR calc non Af Amer >60 >60 mL/min   GFR calc Af Amer >60 >60 mL/min   Anion gap 8 5 - 15  CBC     Status: None   Collection Time: 12/03/19  2:17 PM  Result Value Ref Range   WBC 9.7 4.0 - 10.5 K/uL   RBC 4.65 3.87 - 5.11 MIL/uL   Hemoglobin 15.0 12.0 - 15.0 g/dL   HCT 44.9 67.5 - 91.6 %   MCV 90.8 80.0 - 100.0 fL   MCH 32.3 26.0 - 34.0 pg   MCHC 35.5 30.0 - 36.0 g/dL   RDW 38.4 66.5 - 99.3 %   Platelets 308 150 - 400 K/uL   nRBC 0.0 0.0 - 0.2 %  Urinalysis, Complete w Microscopic     Status: Abnormal   Collection Time: 12/03/19  2:17 PM  Result Value Ref Range   Color, Urine YELLOW (A) YELLOW   APPearance HAZY (A) CLEAR   Specific Gravity, Urine 1.023 1.005 - 1.030   pH 6.0 5.0 - 8.0   Glucose, UA NEGATIVE NEGATIVE mg/dL   Hgb urine dipstick  NEGATIVE NEGATIVE   Bilirubin Urine NEGATIVE NEGATIVE   Ketones, ur NEGATIVE NEGATIVE mg/dL   Protein, ur NEGATIVE NEGATIVE mg/dL   Nitrite NEGATIVE NEGATIVE   Leukocytes,Ua NEGATIVE NEGATIVE   RBC / HPF 0-5 0 - 5 RBC/hpf   WBC, UA 0-5 0 - 5 WBC/hpf   Bacteria, UA NONE SEEN NONE SEEN   Squamous Epithelial / LPF 0-5 0 - 5   Mucus  PRESENT   Pregnancy, urine POC     Status: None   Collection Time: 12/03/19  2:20 PM  Result Value Ref Range   Preg Test, Ur NEGATIVE NEGATIVE   ____________________________________________  ____________________________________________  RADIOLOGY  I personally reviewed all radiographic images ordered to evaluate for the above acute complaints and reviewed radiology reports and findings.  These findings were personally discussed with the patient.  Please see medical record for radiology report.  ____________________________________________   PROCEDURES  Procedure(s) performed:  Procedures    Critical Care performed: no ____________________________________________   INITIAL IMPRESSION / ASSESSMENT AND PLAN / ED COURSE  Pertinent labs & imaging results that were available during my care of the patient were reviewed by me and considered in my medical decision making (see chart for details).   DDX: gastritis, cholecystitis, cholelithiasis, pancreatitis, enteritis  Katharina Mady HaagensenHackney Ching is a 33 y.o. who presents to the ED with symptoms as described above.  Patient nontoxic-appearing afebrile hemodynamically stable.  Exam with mild epigastric tenderness and with recent report of elevated lipase will order ultrasound to evaluate for any evidence of biliary pathology.  Reviewed CT imaging as well as transvaginal ultrasound.  No evidence of acute intra-abdominal process at that time.  Does not seem clinically c/w with torsion or pelvic pathology.  Her blood work is stable here no signs of sepsis or infectious process.  LFTs normal.  Lipase normal.  Denies  any flank pain.  Based on reassuring work-up will give pain medication.  Have high suspicion for gastritis will order ultrasound to evaluate for biliary pathology.   Clinical Course as of Dec 03 1843  Tue Dec 03, 2019  1836 Patient reassessed.  Repeat abdominal exam benign.  Ultrasound does show borderline elevated common bile duct.  Given reassuring blood work as well as reassuring exam I think that she is appropriate for outpatient follow-up and work-up.  She is tolerating PO.  Based on her presentation I do not feel that repeat CT imaging is clinically indicated particularly with benign blood work up benign exam and stable vital signs.  Patient will be treated symptomatically referral referral to GI.  She is instructed to return in 12 - 24 hours if symptoms worsen or she develops fevers for repeat abdominal exam and reassessment.     [PR]    Clinical Course User Index [PR] Willy Eddyobinson, Pairlee Sawtell, MD    The patient was evaluated in Emergency Department today for the symptoms described in the history of present illness. He/she was evaluated in the context of the global COVID-19 pandemic, which necessitated consideration that the patient might be at risk for infection with the SARS-CoV-2 virus that causes COVID-19. Institutional protocols and algorithms that pertain to the evaluation of patients at risk for COVID-19 are in a state of rapid change based on information released by regulatory bodies including the CDC and federal and state organizations. These policies and algorithms were followed during the patient's care in the ED.  As part of my medical decision making, I reviewed the following data within the electronic MEDICAL RECORD NUMBER Nursing notes reviewed and incorporated, Labs reviewed, notes from prior ED visits and [place] Controlled Substance Database   ____________________________________________   FINAL CLINICAL IMPRESSION(S) / ED DIAGNOSES  Final diagnoses:  Epigastric pain      NEW  MEDICATIONS STARTED DURING THIS VISIT:  New Prescriptions   HYDROCODONE-ACETAMINOPHEN (NORCO) 5-325 MG TABLET    Take 1 tablet by mouth every 4 (four) hours as needed for moderate pain.  PANTOPRAZOLE (PROTONIX) 20 MG TABLET    Take 1 tablet (20 mg total) by mouth daily.     Note:  This document was prepared using Dragon voice recognition software and may include unintentional dictation errors.    Willy Eddy, MD 12/03/19 [phone]

## 2019-12-03 NOTE — ED Triage Notes (Signed)
Diagnosed with Pancreatitis at [facility] ED on Saturday.  Arrives today c/o worsening pain and nausea.  States has taken Ibuprofen, Tylenol, and Zofran without relief.  Denies emesis, c/o nausea and LUQ abdominal pain.

## 2020-12-12 ENCOUNTER — Encounter: Payer: Self-pay | Admitting: Emergency Medicine

## 2020-12-12 DIAGNOSIS — G43009 Migraine without aura, not intractable, without status migrainosus: Secondary | ICD-10-CM | POA: Insufficient documentation

## 2020-12-12 DIAGNOSIS — F17210 Nicotine dependence, cigarettes, uncomplicated: Secondary | ICD-10-CM | POA: Insufficient documentation

## 2020-12-12 MED ORDER — ONDANSETRON HCL 4 MG/2ML IJ SOLN: 4.0000 mg | Freq: Once | INTRAMUSCULAR | Status: DC

## 2020-12-12 NOTE — ED Triage Notes (Signed)
Pt c/o HA that started at 1730 today and has not went away after taking 800mg  Ibuprofen and 1G Tylenol. Pt denies facial or extremity weakness/numbness. No droop noted. Equal bilateral grip and strength noted.

## 2020-12-13 ENCOUNTER — Emergency Department
Admission: EM | Admit: 2020-12-13 | Discharge: 2020-12-13 | Disposition: A | Discharge status: Home / Self Care | Payer: Self-pay | Attending: Emergency Medicine | Admitting: Emergency Medicine

## 2020-12-13 ENCOUNTER — Emergency Department: Payer: Self-pay

## 2020-12-13 DIAGNOSIS — G43009 Migraine without aura, not intractable, without status migrainosus: Secondary | ICD-10-CM

## 2020-12-13 LAB — BASIC METABOLIC PANEL
Anion gap: 11 (ref 5–15)
BUN: 13 mg/dL (ref 6–20)
CO2: 25 mmol/L (ref 22–32)
Calcium: 9.1 mg/dL (ref 8.9–10.3)
Chloride: 103 mmol/L (ref 98–111)
Creatinine, Ser: 0.73 mg/dL (ref 0.44–1.00)
GFR, Estimated: >60 mL/min (ref >60)
Glucose, Bld: 100 mg/dL — ABNORMAL HIGH (ref 70–99)
Potassium: 3.9 mmol/L (ref 3.5–5.1)
Sodium: 139 mmol/L (ref 135–145)

## 2020-12-13 LAB — CBC WITH DIFFERENTIAL/PLATELET
Abs Immature Granulocytes: 0.02 10*3/uL (ref 0.00–0.07)
Basophils Absolute: 0.1 10*3/uL (ref 0.0–0.1)
Basophils Relative: 1 %
Eosinophils Absolute: 0.3 10*3/uL (ref 0.0–0.5)
Eosinophils Relative: 3 %
HCT: 39.9 % (ref 36.0–46.0)
Hemoglobin: 13.7 g/dL (ref 12.0–15.0)
Immature Granulocytes: 0 %
Lymphocytes Relative: 40 %
Lymphs Abs: 3.7 10*3/uL (ref 0.7–4.0)
MCH: 33.1 pg (ref 26.0–34.0)
MCHC: 34.3 g/dL (ref 30.0–36.0)
MCV: 96.4 fL (ref 80.0–100.0)
Monocytes Absolute: 0.6 10*3/uL (ref 0.1–1.0)
Monocytes Relative: 7 %
Neutro Abs: 4.6 10*3/uL (ref 1.7–7.7)
Neutrophils Relative %: 49 %
Platelets: 285 10*3/uL (ref 150–400)
RBC: 4.14 MIL/uL (ref 3.87–5.11)
RDW: 13 % (ref 11.5–15.5)
WBC: 9.2 10*3/uL (ref 4.0–10.5)
nRBC: 0 % (ref 0.0–0.2)

## 2020-12-13 MED ORDER — LACTATED RINGERS IV BOLUS
1000.0000 mL | Freq: Once | INTRAVENOUS | Status: AC
  Administered 2020-12-13: 1000 mL via INTRAVENOUS

## 2020-12-13 MED ORDER — ONDANSETRON 4 MG PO TBDP
4.0000 mg | ORAL_TABLET | Freq: Once | ORAL | Status: AC
  Administered 2020-12-13: 4 mg via ORAL
  Filled 2020-12-13: qty 1

## 2020-12-13 MED ORDER — ACETAMINOPHEN 500 MG PO TABS
1000.0000 mg | ORAL_TABLET | Freq: Once | ORAL | Status: AC
  Administered 2020-12-13: 1000 mg via ORAL
  Filled 2020-12-13: qty 2

## 2020-12-13 MED ORDER — ONDANSETRON 4 MG PO TBDP
4.0000 mg | ORAL_TABLET | Freq: Once | ORAL | Status: AC
  Administered 2020-12-13: 4 mg via ORAL

## 2020-12-13 MED ORDER — KETOROLAC TROMETHAMINE 30 MG/ML IJ SOLN
15.0000 mg | Freq: Once | INTRAMUSCULAR | Status: AC
  Administered 2020-12-13: 15 mg via INTRAVENOUS
  Filled 2020-12-13: qty 1

## 2020-12-13 MED ORDER — DROPERIDOL 2.5 MG/ML IJ SOLN
2.5000 mg | Freq: Once | INTRAMUSCULAR | Status: AC
  Administered 2020-12-13: 2.5 mg via INTRAVENOUS
  Filled 2020-12-13: qty 2

## 2020-12-13 NOTE — Discharge Instructions (Addendum)
Please take Tylenol and ibuprofen/Advil for your pain.  It is safe to take them together, or to alternate them every few hours.  Take up to 1000mg  of Tylenol at a time, up to 4 times per day.  Do not take more than 4000 mg of Tylenol in 24 hours.  For ibuprofen, take 400-600 mg, 4-5 times per day.  Return to the ED with any further headaches not controlled with the above medications.  Particularly headaches with fevers, passing out

## 2020-12-13 NOTE — ED Provider Notes (Signed)
[facility] Emergency Department Provider Note ____________________________________________   Event Date/Time   First MD Initiated Contact with Patient 12/13/20 0330     (approximate)  I have reviewed the triage vital signs and the nursing notes.  HISTORY  Chief Complaint Headache   HPI Diane James is a 35 y.o. femalewho presents to the ED for evaluation of headache.  Chart review indicates history of bipolar disorder, migrainous headaches. Patient checked in yesterday at [facility] ED but left AMA, no note documented.  Patient reports being in her typical state of health until developing a rapid onset left-sided temporoparietal headache while seated at about 6 PM on 1/1.  She reports associated nausea without vomiting and photophobia.  Denies emesis, syncope, trauma, chest pain, shortness of breath, abdominal pain.  She reports taken a gram of Tylenol and 800 mg of ibuprofen with minimal to no relief, so she presents to the ED for evaluation.   Past Medical History:  Diagnosis Date  . ADHD (attention deficit hyperactivity disorder)   . Anxiety   . Bipolar disorder ([facility])   . Chicken pox   . Depression   . Enuresis   . Headache   . Migraines   . Neuromuscular disorder [facility])     Patient Active Problem List   Diagnosis Date Noted  . Obesity, Class III, BMI 40-49.9 (morbid obesity) ([facility]) 03/30/2016  . Enuresis 03/30/2016  . Allergic rhinitis 03/30/2016  . ADD (attention deficit disorder) 03/30/2016  . Other specified abnormal immunological findings in serum 11/14/2013  . Anxiety and depression 03/14/2013  . Bipolar I disorder ([facility]) 03/14/2013  . Lumbar strain 04/30/2012    Past Surgical History:  Procedure Laterality Date  . DILATION AND CURETTAGE OF UTERUS    . GANGLION CYST EXCISION     left wrist x3  . OVARIAN CYST REMOVAL     right ovary    Prior to Admission medications   Medication Sig Start Date End Date Taking?  Authorizing Provider  acetaminophen (TYLENOL) 325 MG tablet Take by mouth.    [provider]  desmopressin (DDAVP) 0.2 MG tablet TAKE 3 TABLETS (600 MCG TOTAL) BY MOUTH AT BEDTIME. 06/24/18   Duanne Limerick, MD  HYDROcodone-acetaminophen (NORCO) 5-325 MG tablet Take 1 tablet by mouth every 4 (four) hours as needed for moderate pain. 12/03/19   Willy Eddy, MD  pantoprazole (PROTONIX) 20 MG tablet Take 1 tablet (20 mg total) by mouth daily. 12/03/19 12/02/20  Willy Eddy, MD    Allergies Poison ivy extract, Poison oak extract, Morphine, and Silvadene [silver sulfadiazine]  Family History  Problem Relation Age of Onset  . Fibromyalgia Mother   . Cancer Maternal Grandmother        breast cancer  . Anxiety disorder Father   . Depression Father   . ADD / ADHD Brother   . ADD / ADHD Brother   . Bipolar disorder Paternal Uncle     Social History Social History   Tobacco Use  . Smoking status: Current Every Day Smoker    Packs/day: 1.00    Years: 7.00    Pack years: 7.00    Types: Cigarettes  . Smokeless tobacco: Never Used  Substance Use Topics  . Alcohol use: No  . Drug use: No    Review of Systems  Constitutional: No fever/chills Eyes: No visual changes. ENT: No sore throat. Cardiovascular: Denies chest pain. Respiratory: Denies shortness of breath. Gastrointestinal: No abdominal pain. no vomiting.  No diarrhea.  No constipation. Positive for nausea Genitourinary: Negative for dysuria. Musculoskeletal: Negative for back pain. Skin: Negative for rash. Neurological: Negative for focal weakness or numbness.  Positive for headache and photophobia  ____________________________________________   PHYSICAL EXAM:  VITAL SIGNS: Vitals:   12/13/20 0336 12/13/20 0447  BP: 130/72 113/69  Pulse: 78 72  Resp: 18 18  Temp:    SpO2: 96% 95%     Constitutional: Alert and oriented.  Appears uncomfortable and photophobic, but conversational in full  sentences.  Obese. Eyes: Conjunctivae are normal. PERRL. EOMI. Head: Atraumatic. Nose: No congestion/rhinnorhea. Mouth/Throat: Mucous membranes are moist.  Oropharynx non-erythematous. Neck: No stridor. No cervical spine tenderness to palpation. Cardiovascular: Normal rate, regular rhythm. Grossly normal heart sounds.  Good peripheral circulation. Respiratory: Normal respiratory effort.  No retractions. Lungs CTAB. Gastrointestinal: Soft , nondistended, nontender to palpation. No CVA tenderness. Musculoskeletal: No lower extremity tenderness nor edema.  No joint effusions. No signs of acute trauma. Neurologic:  Normal speech and language. No gross focal neurologic deficits are appreciated. No gait instability noted. Cranial nerves II through XII intact 5/5 strength and sensation in all 4 extremities Skin:  Skin is warm, dry and intact. No rash noted. Psychiatric: Mood and affect are normal. Speech and behavior are normal.  ____________________________________________   LABS (all labs ordered are listed, but only abnormal results are displayed)  Labs Reviewed  BASIC METABOLIC PANEL - Abnormal; Notable for the following components:      Result Value   Glucose, Bld 100 (*)    All other components within normal limits  CBC WITH DIFFERENTIAL/PLATELET     ____________________________________________  RADIOLOGY  ED MD interpretation: CT head reviewed by me without evidence of acute intracranial pathology.  Official radiology report(s): CT Head Wo Contrast  Result Date: 12/13/2020 CLINICAL DATA:  Headache EXAM: CT HEAD WITHOUT CONTRAST TECHNIQUE: Contiguous axial images were obtained from the base of the skull through the vertex without intravenous contrast. COMPARISON:  None. FINDINGS: Brain: Normal anatomic configuration. No abnormal intra or extra-axial mass lesion or fluid collection. No abnormal mass effect or midline shift. No evidence of acute intracranial hemorrhage or infarct.  Ventricular size is normal. Cerebellum unremarkable. Vascular: Unremarkable Skull: Intact Sinuses/Orbits: There is complete opacification of the left sphenoid sinus with central hyperdensity suggesting chronic or fungal sinusitis. Remaining paranasal sinuses are clear. Orbits are unremarkable. Other: Mastoid air cells and middle ear cavities are clear. IMPRESSION: No acute intracranial abnormality. Chronic or fungal left sphenoid sinusitis. Electronically Signed   By: Fidela Salisbury MD   On: 12/13/2020 02:35    ____________________________________________   PROCEDURES and INTERVENTIONS  Procedure(s) performed (including Critical Care):  Procedures  Medications  ondansetron (ZOFRAN-ODT) disintegrating tablet 4 mg (4 mg Oral Given 12/13/20 0006)  ondansetron (ZOFRAN-ODT) disintegrating tablet 4 mg (4 mg Oral Given 12/13/20 0200)  acetaminophen (TYLENOL) tablet 1,000 mg (1,000 mg Oral Given 12/13/20 0445)  ketorolac (TORADOL) 30 MG/ML injection 15 mg (15 mg Intravenous Given 12/13/20 0445)  droperidol (INAPSINE) 2.5 MG/ML injection 2.5 mg (2.5 mg Intravenous Given 12/13/20 0445)  lactated ringers bolus 1,000 mL (1,000 mLs Intravenous New Bag/Given 12/13/20 0446)    ____________________________________________   MDM / ED COURSE   35 year old woman with history of migraines and bipolar disorder presents to the ED with a migrainous headache, resolved after single migraine cocktail, and amenable to outpatient management.  Normal vitals on room air.  Exam without neurovascular deficits, trauma or signs of distress.  She appears photophobic and uncomfortable, but this  resolved after migraine cocktail.  Basic labs unremarkable.  CT head obtained in triage without evidence of ICH or acute pathology.  Discussed return precautions for the ED and patient is medically stable for discharge home.   Clinical Course as of 12/13/20 0539  Wynelle Link Dec 13, 2020  0522 Reassessed.  Patient reports feeling much better.  We  discussed the possibility of outpatient management, and she is agreeable [DS]    Clinical Course User Index [DS] Delton Prairie, MD    ____________________________________________   FINAL CLINICAL IMPRESSION(S) / ED DIAGNOSES  Final diagnoses:  Migraine without aura and without status migrainosus, not intractable     ED Discharge Orders    None       Jalayiah Bibian Printup   Note:  This document was prepared using Dragon voice recognition software and may include unintentional dictation errors.   Delton Prairie, MD 12/13/20 [phone]

## 2020-12-13 NOTE — ED Notes (Signed)
Discussed with Dr. Don Perking about patient, orders received.

## 2020-12-27 IMAGING — US US ABDOMEN LIMITED
1 series · 14 of 24 positions shown · non-contrast
Comparison: None.

CLINICAL DATA: Epigastric pain for 4 days

EXAM:
ULTRASOUND ABDOMEN LIMITED RIGHT UPPER QUADRANT

[Series 1: us abdomen limited · 14 of 24 slices shown]
[im 1/24]
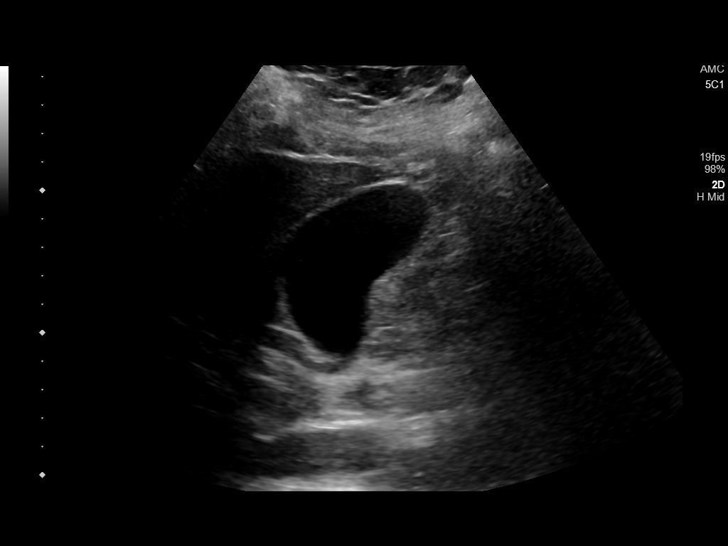
[im 3/24]
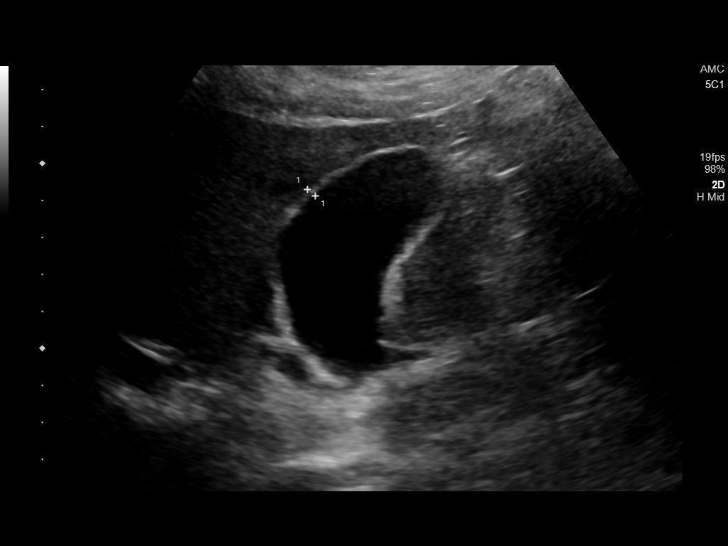
[im 5/24]
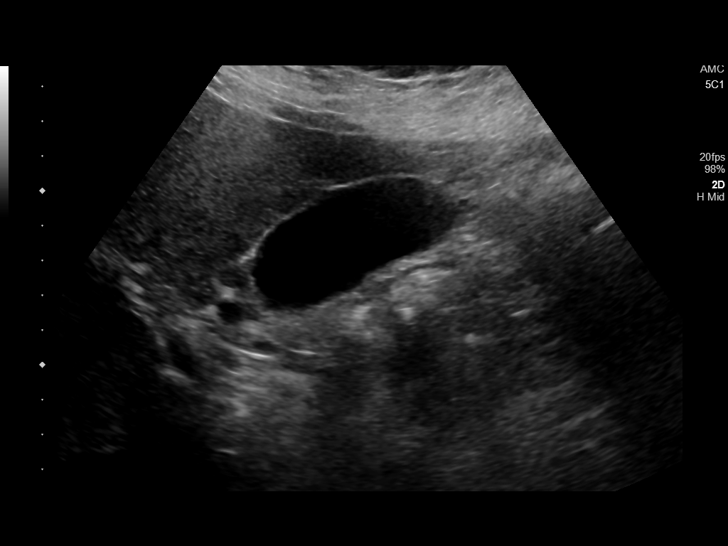
[im 7/24]
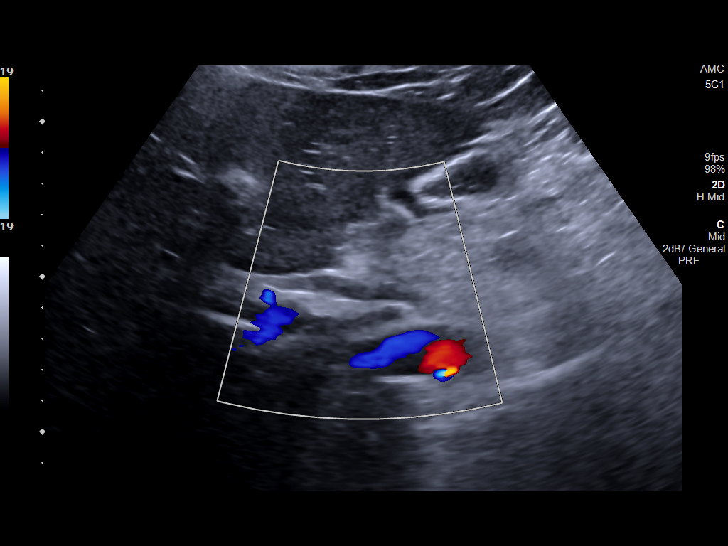
[im 8/24]
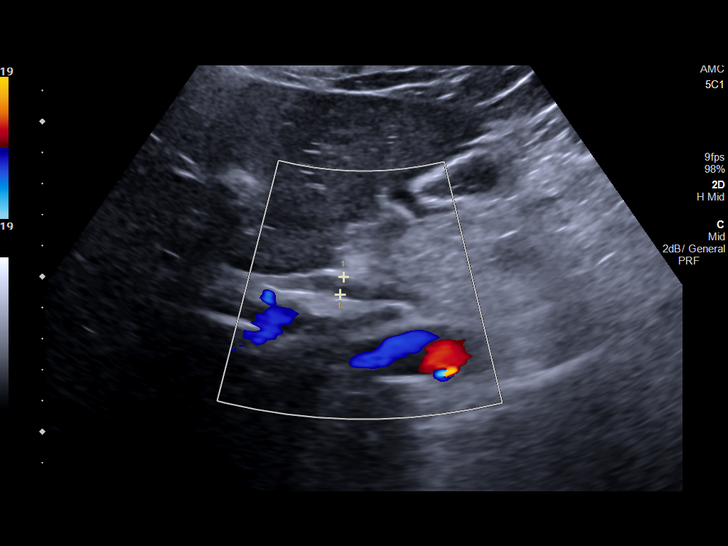
[im 10/24]
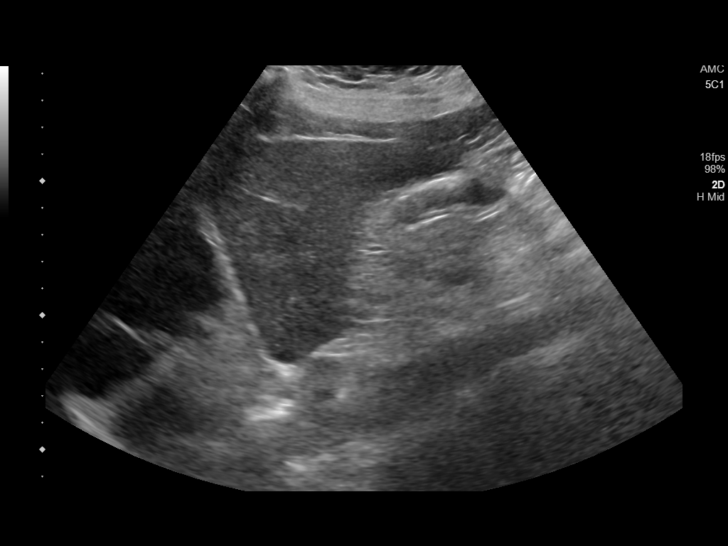
[im 12/24]
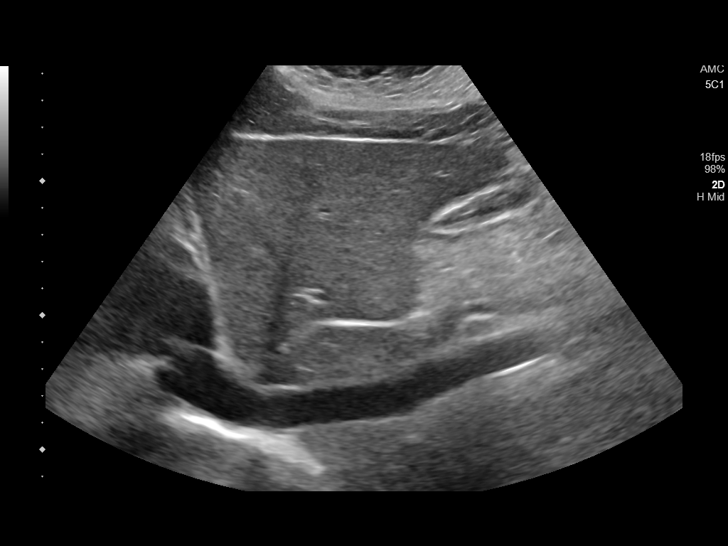
[im 13/24]
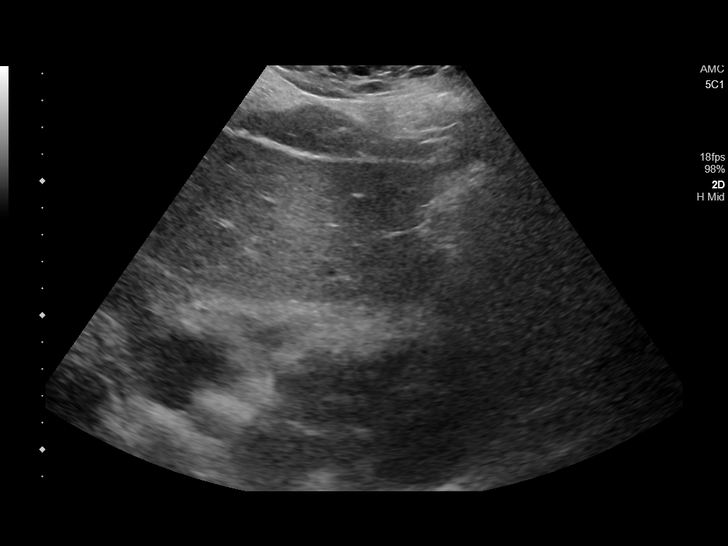
[im 15/24]
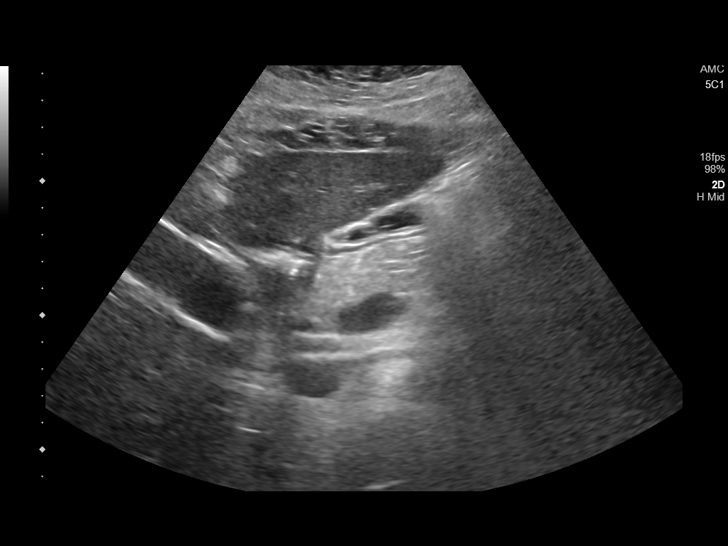
[im 17/24]
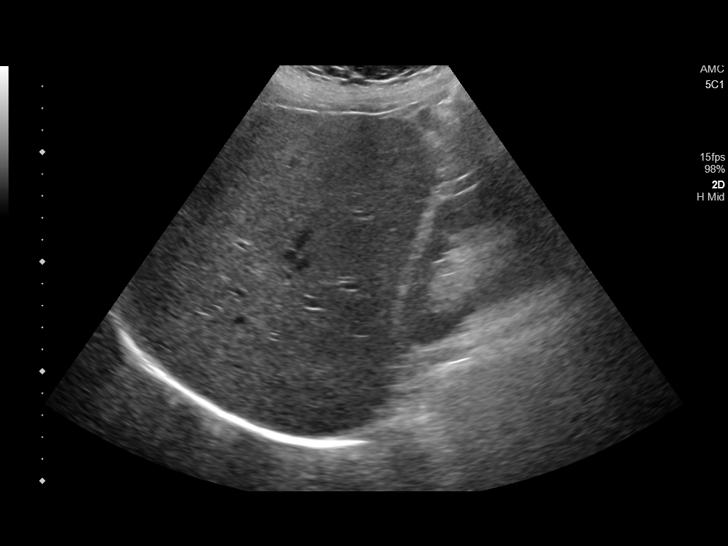
[im 19/24]
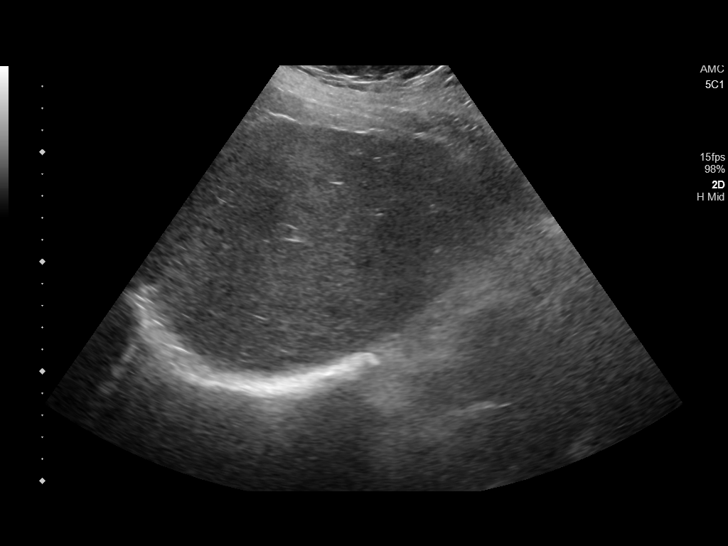
[im 20/24]
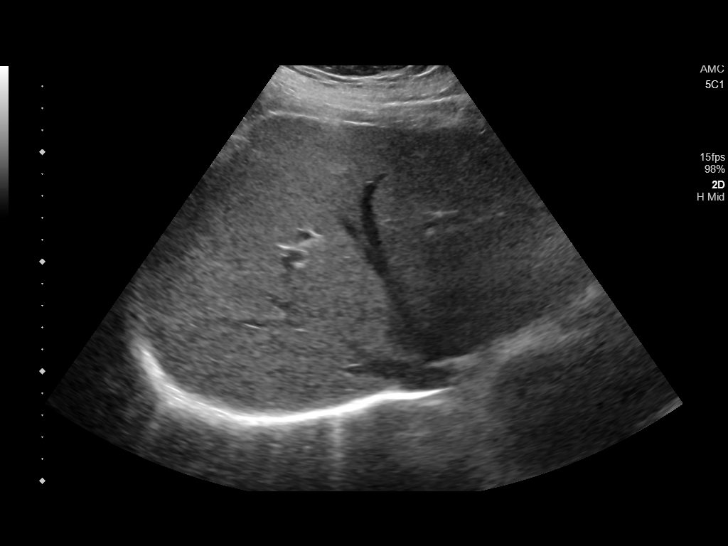
[im 22/24]
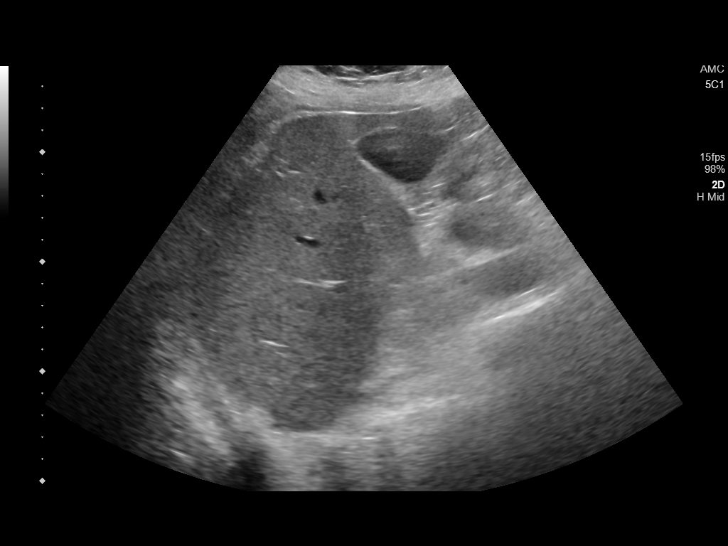
[im 24/24]
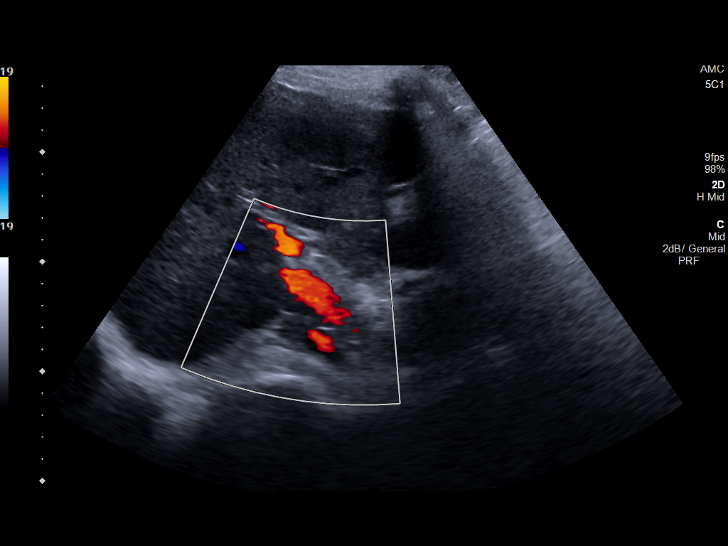

[14 of 24 positions shown; findings below may reference images not displayed]

FINDINGS: Gallbladder:

No gallstones or wall thickening visualized. No sonographic Murphy
sign noted by sonographer.

Common bile duct:

Diameter: 5.8 mm

Liver:

No focal lesion identified. Within normal limits in parenchymal
echogenicity. Portal vein is patent on color Doppler imaging with
normal direction of blood flow towards the liver.

Other: None.
IMPRESSION: Common bile duct is mildly dilated, no signs of biliary calculi or
visualized choledocholithiasis. Distal duct not imaged. Correlation
with laboratory values and clinical history is suggested to
determine whether further evaluation with MRCP may be warranted.

No signs of acute cholecystitis.

## 2021-07-20 DIAGNOSIS — F900 Attention-deficit hyperactivity disorder, predominantly inattentive type: Secondary | ICD-10-CM | POA: Diagnosis not present

## 2021-07-20 DIAGNOSIS — F4310 Post-traumatic stress disorder, unspecified: Secondary | ICD-10-CM | POA: Diagnosis not present

## 2021-07-20 DIAGNOSIS — F39 Unspecified mood [affective] disorder: Secondary | ICD-10-CM | POA: Diagnosis not present

## 2021-08-17 DIAGNOSIS — Z6841 Body Mass Index (BMI) 40.0 and over, adult: Secondary | ICD-10-CM | POA: Diagnosis not present

## 2021-08-17 DIAGNOSIS — F900 Attention-deficit hyperactivity disorder, predominantly inattentive type: Secondary | ICD-10-CM | POA: Diagnosis not present

## 2021-08-26 DIAGNOSIS — K5909 Other constipation: Secondary | ICD-10-CM | POA: Diagnosis not present

## 2021-08-26 DIAGNOSIS — N3944 Nocturnal enuresis: Secondary | ICD-10-CM | POA: Diagnosis not present

## 2021-08-26 DIAGNOSIS — H10029 Other mucopurulent conjunctivitis, unspecified eye: Secondary | ICD-10-CM | POA: Diagnosis not present

## 2021-08-26 DIAGNOSIS — F4310 Post-traumatic stress disorder, unspecified: Secondary | ICD-10-CM | POA: Diagnosis not present

## 2021-08-26 DIAGNOSIS — F17210 Nicotine dependence, cigarettes, uncomplicated: Secondary | ICD-10-CM | POA: Diagnosis not present

## 2021-08-26 DIAGNOSIS — H10403 Unspecified chronic conjunctivitis, bilateral: Secondary | ICD-10-CM | POA: Diagnosis not present

## 2021-08-26 DIAGNOSIS — F39 Unspecified mood [affective] disorder: Secondary | ICD-10-CM | POA: Diagnosis not present

## 2021-08-26 DIAGNOSIS — Z136 Encounter for screening for cardiovascular disorders: Secondary | ICD-10-CM | POA: Diagnosis not present

## 2021-08-26 DIAGNOSIS — E669 Obesity, unspecified: Secondary | ICD-10-CM | POA: Diagnosis not present

## 2021-09-02 DIAGNOSIS — F39 Unspecified mood [affective] disorder: Secondary | ICD-10-CM | POA: Diagnosis not present

## 2021-09-02 DIAGNOSIS — F4310 Post-traumatic stress disorder, unspecified: Secondary | ICD-10-CM | POA: Diagnosis not present

## 2021-09-10 DIAGNOSIS — F4310 Post-traumatic stress disorder, unspecified: Secondary | ICD-10-CM | POA: Diagnosis not present

## 2021-09-14 DIAGNOSIS — F900 Attention-deficit hyperactivity disorder, predominantly inattentive type: Secondary | ICD-10-CM | POA: Diagnosis not present

## 2021-09-20 DIAGNOSIS — F4310 Post-traumatic stress disorder, unspecified: Secondary | ICD-10-CM | POA: Diagnosis not present

## 2021-09-20 DIAGNOSIS — F39 Unspecified mood [affective] disorder: Secondary | ICD-10-CM | POA: Diagnosis not present

## 2021-09-27 DIAGNOSIS — F4310 Post-traumatic stress disorder, unspecified: Secondary | ICD-10-CM | POA: Diagnosis not present

## 2021-09-27 DIAGNOSIS — F39 Unspecified mood [affective] disorder: Secondary | ICD-10-CM | POA: Diagnosis not present

## 2021-10-12 DIAGNOSIS — F900 Attention-deficit hyperactivity disorder, predominantly inattentive type: Secondary | ICD-10-CM | POA: Diagnosis not present

## 2021-11-02 ENCOUNTER — Other Ambulatory Visit: Payer: Self-pay

## 2021-11-02 ENCOUNTER — Ambulatory Visit
Admission: EM | Admit: 2021-11-02 | Discharge: 2021-11-02 | Disposition: A | Discharge status: Home / Self Care | Payer: BC Managed Care – PPO

## 2021-11-02 DIAGNOSIS — L02211 Cutaneous abscess of abdominal wall: Secondary | ICD-10-CM

## 2021-11-02 DIAGNOSIS — L02413 Cutaneous abscess of right upper limb: Secondary | ICD-10-CM

## 2021-11-02 MED ORDER — CLINDAMYCIN HCL 300 MG PO CAPS: 300.0000 mg | ORAL_CAPSULE | Freq: Four times a day (QID) | ORAL | 0 refills | Status: AC

## 2021-11-02 MED ORDER — MUPIROCIN 2 % EX OINT: 1.0000 "application " | TOPICAL_OINTMENT | Freq: Two times a day (BID) | CUTANEOUS | 0 refills | Status: DC

## 2021-11-02 NOTE — Discharge Instructions (Addendum)
-  I have sent clindamycin to pharmacy for you to treat the infection.  Likely staph or resistant staph infection. - Also sent an ointment. - Keep area clean and dry.  Clean with soap and water and then apply the ointment and make sure to change it regularly especially if it is draining.  Warm compresses to area to help facilitate drainage as well. - Tylenol or Motrin as needed for discomfort but it should be improving greatly in the next 2 to 3 days.  If it is not or if it is worsening need to be seen again. - Go to emergency department for any severe acute worsening of symptoms such as if you get a fever or significantly increased redness, swelling or red streak up the arm or abdomen. - I placed a referral to infectious disease for you.

## 2021-11-02 NOTE — ED Triage Notes (Signed)
Pt c/o of abscess on right arm and left side of stomach that is painful to the touch and warm. Pt states that there has been drainage from the wounds.. Pt states that the bumps were noticeable on 10/28/21.

## 2021-11-02 NOTE — ED Provider Notes (Signed)
[facility]    CSN: [id number] Arrival date & time: 11/02/21  1317      History   Chief Complaint Chief Complaint  Patient presents with   Abscess    HPI Diane James is a 35 y.o. female presenting for an area of redness, swelling and pustular drainage of the right forearm over the past week.  She also has a similar area on her abdomen.  She reports she has a history of hidradenitis suppurativa.  Reports recurrent skin infections.  History of conjunctivitis due to staph aureus.  Patient concerned about possible spider bite regarding her infection of her forearm.  She says that she has not had any fevers or weakness but she generally does not feel well.  No history of MRSA.  Patient not being managed by anyone for her at bedtime.  Reports that she has been on doxycycline multiple times in the past and it generally does not work for her anymore.  She has no other complaints.  HPI  Past Medical History:  Diagnosis Date   ADHD (attention deficit hyperactivity disorder)    Anxiety    Bipolar disorder (HCC)    Chicken pox    Depression    Enuresis    Headache    Migraines    Neuromuscular disorder [facility])     Patient Active Problem List   Diagnosis Date Noted   Obesity, Class III, BMI 40-49.9 (morbid obesity) (HCC) 03/30/2016   Enuresis 03/30/2016   Allergic rhinitis 03/30/2016   ADD (attention deficit disorder) 03/30/2016   Other specified abnormal immunological findings in serum 11/14/2013   Anxiety and depression 03/14/2013   Bipolar I disorder (HCC) 03/14/2013   Lumbar strain 04/30/2012    Past Surgical History:  Procedure Laterality Date   DILATION AND CURETTAGE OF UTERUS     GANGLION CYST EXCISION     left wrist x3   OVARIAN CYST REMOVAL     right ovary    OB History     Gravida  6   Para  3   Term  2   Preterm  1   AB  2   Living  2      SAB  2   IAB      Ectopic      Multiple      Live Births           Obstetric  Comments  3rd pregnancy was stillborn          Home Medications    Prior to Admission medications   Medication Sig Start Date End Date Taking? Authorizing Provider  acetaminophen (TYLENOL) 325 MG tablet Take by mouth.   Yes [provider]  clindamycin (CLEOCIN) 300 MG capsule Take 1 capsule (300 mg total) by mouth 4 (four) times daily for 7 days. 11/02/21 11/09/21 Yes Shirlee Latch, PA-C  lisdexamfetamine (VYVANSE) 40 MG capsule Take by mouth. 10/12/21  Yes [provider]  mupirocin ointment (BACTROBAN) 2 % Apply 1 application topically 2 (two) times daily. 11/02/21  Yes Eusebio Friendly B, PA-C  desmopressin (DDAVP) 0.2 MG tablet TAKE 3 TABLETS (600 MCG TOTAL) BY MOUTH AT BEDTIME. 06/24/18   Duanne Limerick, MD  pantoprazole (PROTONIX) 20 MG tablet Take 1 tablet (20 mg total) by mouth daily. 12/03/19 12/02/20  Willy Eddy, MD    Family History Family History  Problem Relation Age of Onset   Fibromyalgia Mother    Cancer Maternal Grandmother  breast cancer   Anxiety disorder Father    Depression Father    ADD / ADHD Brother    ADD / ADHD Brother    Bipolar disorder Paternal Uncle     Social History Social History   Tobacco Use   Smoking status: Every Day    Packs/day: 1.00    Years: 7.00    Pack years: 7.00    Types: Cigarettes   Smokeless tobacco: Never  Substance Use Topics   Alcohol use: No   Drug use: No     Allergies   Poison ivy extract, Poison oak extract, Morphine, and Silvadene [silver sulfadiazine]   Review of Systems Review of Systems  Constitutional:  Negative for chills, fatigue and fever.  Musculoskeletal:  Negative for arthralgias, joint swelling and myalgias.  Skin:  Positive for color change and wound.  Neurological:  Negative for weakness.    Physical Exam Triage Vital Signs ED Triage Vitals  Enc Vitals Group     BP 11/02/21 1445 123/62     Pulse Rate 11/02/21 1445 87     Resp 11/02/21 1445 18     Temp  11/02/21 1445 98.6 F (37 C)     Temp Source 11/02/21 1445 Oral     SpO2 11/02/21 1445 100 %     Weight 11/02/21 1441 259 lb 0.7 oz (117.5 kg)     Height 11/02/21 1441 5\' 4"  (1.626 m)     Head Circumference --      Peak Flow --      Pain Score 11/02/21 1441 10     Pain Loc --      Pain Edu? --      Excl. in GC? --    No data found.  Updated Vital Signs BP 123/62 (BP Location: Left Arm)   Pulse 87   Temp 98.6 F (37 C) (Oral)   Resp 18   Ht 5\' 4"  (1.626 m)   Wt 259 lb 0.7 oz (117.5 kg)   LMP 10/17/2021   SpO2 100%   BMI 44.46 kg/m     Physical Exam Vitals and nursing note reviewed.  Constitutional:      General: She is not in acute distress.    Appearance: Normal appearance. She is not ill-appearing or toxic-appearing.  HENT:     Head: Normocephalic and atraumatic.  Eyes:     General: No scleral icterus.       Right eye: No discharge.        Left eye: No discharge.     Conjunctiva/sclera: Conjunctivae normal.  Cardiovascular:     Rate and Rhythm: Normal rate and regular rhythm.     Heart sounds: Normal heart sounds.  Pulmonary:     Effort: Pulmonary effort is normal. No respiratory distress.     Breath sounds: Normal breath sounds.  Musculoskeletal:     Cervical back: Neck supple.  Skin:    General: Skin is dry.     Comments: Right ventral forearm: There is an area of erythema, induration and central opening with thick yellowish-green drainage.  Area is diffusely tender.  Surrounding erythema as shown in picture below.  Left abdomen: See image below.  There is a small area of erythema and induration without fluctuance.  No drainage at this time.  Both areas are extremely tender to palpation.  Additionally she has numerous areas of scarring in bilateral axilla and groin area related to hidradenitis suppurativa.  Neurological:     General: No focal deficit  present.     Mental Status: She is alert. Mental status is at baseline.     Motor: No weakness.      Gait: Gait normal.  Psychiatric:        Mood and Affect: Mood normal.        Behavior: Behavior normal.        Thought Content: Thought content normal.        UC Treatments / Results  Labs (all labs ordered are listed, but only abnormal results are displayed) Labs Reviewed - No data to display  EKG   Radiology No results found.  Procedures Procedures (including critical care time)  Medications Ordered in UC Medications - No data to display  Initial Impression / Assessment and Plan / UC Course  I have reviewed the triage vital signs and the nursing notes.  Pertinent labs & imaging results that were available during my care of the patient were reviewed by me and considered in my medical decision making (see chart for details).  35 year old female with history of hidradenitis suppurativa and recurrent skin infections presents for abscess of the right forearm and similar area of the left abdomen.  Areas have been present for about a week.  Condition is worsening.  Not associated with any fevers.  Suspect staph versus possible MRSA.  Given that patient has had doxycycline many times in the past and has not always worked, I question if there is some resistance to holding off on that medication.  Patient reports rash and burning when using silver sulfadiazine so unsure about using Bactrim DS.  We will treat with clindamycin at this time as well as mupirocin ointment.  Reviewed wound care techniques.  Reviewed going to emergency department for any worsening of her symptoms especially if she has a fever or the areas are looking worse.  I did place referral to infectious disease and dermatology.   Final Clinical Impressions(s) / UC Diagnoses   Final diagnoses:  Abscess of right forearm  Abscess of skin of abdomen     Discharge Instructions      -I have sent clindamycin to pharmacy for you to treat the infection.  Likely staph or resistant staph infection. - Also sent an  ointment. - Keep area clean and dry.  Clean with soap and water and then apply the ointment and make sure to change it regularly especially if it is draining.  Warm compresses to area to help facilitate drainage as well. - Tylenol or Motrin as needed for discomfort but it should be improving greatly in the next 2 to 3 days.  If it is not or if it is worsening need to be seen again. - Go to emergency department for any severe acute worsening of symptoms such as if you get a fever or significantly increased redness, swelling or red streak up the arm or abdomen. - I placed a referral to infectious disease for you.   ED Prescriptions     Medication Sig Dispense Auth. Provider   clindamycin (CLEOCIN) 300 MG capsule Take 1 capsule (300 mg total) by mouth 4 (four) times daily for 7 days. 28 capsule Eusebio Friendly B, PA-C   mupirocin ointment (BACTROBAN) 2 % Apply 1 application topically 2 (two) times daily. 22 g Shirlee Latch, PA-C      PDMP not reviewed this encounter.   Shirlee Latch, PA-C 11/02/21 1637

## 2021-11-04 ENCOUNTER — Emergency Department
Admission: EM | Admit: 2021-11-04 | Discharge: 2021-11-04 | Disposition: A | Discharge status: Home / Self Care | Payer: BC Managed Care – PPO | Attending: Emergency Medicine | Admitting: Emergency Medicine

## 2021-11-04 ENCOUNTER — Encounter: Payer: Self-pay | Admitting: Emergency Medicine

## 2021-11-04 ENCOUNTER — Other Ambulatory Visit: Payer: Self-pay

## 2021-11-04 DIAGNOSIS — L02413 Cutaneous abscess of right upper limb: Secondary | ICD-10-CM | POA: Insufficient documentation

## 2021-11-04 DIAGNOSIS — M79631 Pain in right forearm: Secondary | ICD-10-CM | POA: Diagnosis not present

## 2021-11-04 DIAGNOSIS — F17210 Nicotine dependence, cigarettes, uncomplicated: Secondary | ICD-10-CM | POA: Insufficient documentation

## 2021-11-04 DIAGNOSIS — R52 Pain, unspecified: Secondary | ICD-10-CM

## 2021-11-04 DIAGNOSIS — L02211 Cutaneous abscess of abdominal wall: Secondary | ICD-10-CM | POA: Diagnosis not present

## 2021-11-04 DIAGNOSIS — L0291 Cutaneous abscess, unspecified: Secondary | ICD-10-CM

## 2021-11-04 MED ORDER — KETOROLAC TROMETHAMINE 30 MG/ML IJ SOLN
30.0000 mg | Freq: Once | INTRAMUSCULAR | Status: AC
  Administered 2021-11-04: 30 mg via INTRAMUSCULAR
  Filled 2021-11-04: qty 1

## 2021-11-04 MED ORDER — OXYCODONE-ACETAMINOPHEN 5-325 MG PO TABS: 1.0000 | ORAL_TABLET | ORAL | 0 refills | Status: DC | PRN

## 2021-11-04 MED ORDER — IBUPROFEN 800 MG PO TABS: 800.0000 mg | ORAL_TABLET | Freq: Three times a day (TID) | ORAL | 0 refills | Status: DC | PRN

## 2021-11-04 NOTE — Discharge Instructions (Signed)
1.  Continue and finish antibiotics as previously prescribed. 2.  You may take pain medicines as needed. 3.  Return to the ER for worsening symptoms, persistent vomiting, difficulty breathing or other concerns.

## 2021-11-04 NOTE — ED Triage Notes (Signed)
Pt comes into the ED via POV c/o abscess to the right forearm and the abdomen.  Pt has been seen at [facility] and given abx.  Pt states the redness isnt getting worse, but the pain is getting worse.  Pt in NAD at this time.

## 2021-11-04 NOTE — ED Notes (Signed)
ED Provider at bedside. 

## 2021-11-04 NOTE — ED Provider Notes (Signed)
[facility] Emergency Department Provider Note   ____________________________________________   Event Date/Time   First MD Initiated Contact with Patient 11/04/21 0423     (approximate)  I have reviewed the triage vital signs and the nursing notes.   HISTORY  Chief Complaint Abscess    HPI Diane James is a 35 y.o. female who presents to the ED from home with a chief complaint of pain from skin abscesses.  Patient was seen at urgent care yesterday and prescribed clindamycin for small abscesses to right forearm and left mid abdomen.  Reports abscesses are not worse but she is here for pain control.  Denies fever, chills, chest pain, shortness of breath, nausea, vomiting or dizziness.      Past Medical History:  Diagnosis Date   ADHD (attention deficit hyperactivity disorder)    Anxiety    Bipolar disorder (HCC)    Chicken pox    Depression    Enuresis    Headache    Migraines    Neuromuscular disorder [facility])     Patient Active Problem List   Diagnosis Date Noted   Obesity, Class III, BMI 40-49.9 (morbid obesity) (HCC) 03/30/2016   Enuresis 03/30/2016   Allergic rhinitis 03/30/2016   ADD (attention deficit disorder) 03/30/2016   Other specified abnormal immunological findings in serum 11/14/2013   Anxiety and depression 03/14/2013   Bipolar I disorder (HCC) 03/14/2013   Lumbar strain 04/30/2012    Past Surgical History:  Procedure Laterality Date   DILATION AND CURETTAGE OF UTERUS     GANGLION CYST EXCISION     left wrist x3   OVARIAN CYST REMOVAL     right ovary    Prior to Admission medications   Medication Sig Start Date End Date Taking? Authorizing Provider  ibuprofen (ADVIL) 800 MG tablet Take 1 tablet (800 mg total) by mouth every 8 (eight) hours as needed for moderate pain. 11/04/21  Yes Irean Hong, MD  oxyCODONE-acetaminophen (PERCOCET/ROXICET) 5-325 MG tablet Take 1 tablet by mouth every 4 (four) hours as needed  for severe pain. 11/04/21  Yes Irean Hong, MD  acetaminophen (TYLENOL) 325 MG tablet Take by mouth.    [provider]  clindamycin (CLEOCIN) 300 MG capsule Take 1 capsule (300 mg total) by mouth 4 (four) times daily for 7 days. 11/02/21 11/09/21  Eusebio Friendly B, PA-C  desmopressin (DDAVP) 0.2 MG tablet TAKE 3 TABLETS (600 MCG TOTAL) BY MOUTH AT BEDTIME. 06/24/18   Duanne Limerick, MD  lisdexamfetamine (VYVANSE) 40 MG capsule Take by mouth. 10/12/21   [provider]  mupirocin ointment (BACTROBAN) 2 % Apply 1 application topically 2 (two) times daily. 11/02/21   Shirlee Latch, PA-C  pantoprazole (PROTONIX) 20 MG tablet Take 1 tablet (20 mg total) by mouth daily. 12/03/19 12/02/20  Willy Eddy, MD    Allergies Poison ivy extract, Poison oak extract, Morphine, and Silvadene [silver sulfadiazine]  Family History  Problem Relation Age of Onset   Fibromyalgia Mother    Cancer Maternal Grandmother        breast cancer   Anxiety disorder Father    Depression Father    ADD / ADHD Brother    ADD / ADHD Brother    Bipolar disorder Paternal Uncle     Social History Social History   Tobacco Use   Smoking status: Every Day    Packs/day: 1.00    Years: 7.00    Pack years: 7.00  Types: Cigarettes   Smokeless tobacco: Never  Substance Use Topics   Alcohol use: No   Drug use: No    Review of Systems  Constitutional: No fever/chills Eyes: No visual changes. ENT: No sore throat. Cardiovascular: Denies chest pain. Respiratory: Denies shortness of breath. Gastrointestinal: No abdominal pain.  No nausea, no vomiting.  No diarrhea.  No constipation. Genitourinary: Negative for dysuria. Musculoskeletal: Negative for back pain. Skin: Positive for small skin abscesses.  Negative for rash. Neurological: Negative for headaches, focal weakness or numbness.   ____________________________________________   PHYSICAL EXAM:  VITAL SIGNS: ED Triage Vitals  [11/04/21 0419]  Enc Vitals Group     BP 124/82     Pulse Rate 90     Resp 17     Temp 98.2 F (36.8 C)     Temp Source Oral     SpO2 97 %     Weight 259 lb 0.7 oz (117.5 kg)     Height 5\' 4"  (1.626 m)     Head Circumference      Peak Flow      Pain Score 10     Pain Loc      Pain Edu?      Excl. in GC?     Constitutional: Alert and oriented. Well appearing and in no acute distress. Eyes: Conjunctivae are normal. PERRL. EOMI. Head: Atraumatic. Nose: No congestion/rhinnorhea. Mouth/Throat: Mucous membranes are moist.  Oropharynx non-erythematous. Neck: No stridor.   Cardiovascular: Normal rate, regular rhythm. Grossly normal heart sounds.  Good peripheral circulation. Respiratory: Normal respiratory effort.  No retractions. Lungs CTAB. Gastrointestinal: Soft and nontender. No distention. No abdominal bruits. No CVA tenderness. Musculoskeletal: No lower extremity tenderness nor edema.  No joint effusions. Neurologic:  Normal speech and language. No gross focal neurologic deficits are appreciated. No gait instability. Skin:  Skin is warm, dry and intact. No rash noted. Right forearm abscess small, draining without surrounding warmth/erythema/streaking. Left abdominal abscess small, draining without surrounding warmth/erythema/streaking. Psychiatric: Mood and affect are normal. Speech and behavior are normal.  ____________________________________________   LABS (all labs ordered are listed, but only abnormal results are displayed)  Labs Reviewed - No data to display ____________________________________________  EKG  None ____________________________________________  RADIOLOGY I, Kelee Cunningham J, personally viewed and evaluated these images (plain radiographs) as part of my medical decision making, as well as reviewing the written report by the radiologist.  ED MD interpretation: None  Official radiology report(s): No results  found.  ____________________________________________   PROCEDURES  Procedure(s) performed (including Critical Care):  Procedures   ____________________________________________   INITIAL IMPRESSION / ASSESSMENT AND PLAN / ED COURSE  As part of my medical decision making, I reviewed the following data within the electronic MEDICAL RECORD NUMBER Nursing notes reviewed and incorporated, Old chart reviewed, and [place] Controlled Substance Database     35 year old female presents to the ED for pain control from skin abscesses; on clindamycin x1 day.  She is driving; will administer IM Toradol and discharged home with prescription for Motrin and Percocet.  Advised patient not to cover skin abscesses with Band-Aids and instead to cover them with gauze and 1 strip of tape.  Strict return precautions given.  Patient verbalizes understanding and agrees with plan of care.      ____________________________________________   FINAL CLINICAL IMPRESSION(S) / ED DIAGNOSES  Final diagnoses:  Abscess  Pain     ED Discharge Orders          Ordered    ibuprofen (  ADVIL) 800 MG tablet  Every 8 hours PRN        11/04/21 0427    oxyCODONE-acetaminophen (PERCOCET/ROXICET) 5-325 MG tablet  Every 4 hours PRN        11/04/21 0427             Note:  This document was prepared using Dragon voice recognition software and may include unintentional dictation errors.    Irean Hong, MD 11/04/21 [phone]

## 2021-11-11 ENCOUNTER — Other Ambulatory Visit: Payer: Self-pay

## 2021-11-11 ENCOUNTER — Ambulatory Visit: Payer: BC Managed Care – PPO | Attending: Infectious Diseases | Admitting: Infectious Diseases

## 2021-11-11 ENCOUNTER — Encounter: Payer: Self-pay | Admitting: Infectious Diseases

## 2021-11-11 ENCOUNTER — Other Ambulatory Visit
Admission: RE | Admit: 2021-11-11 | Discharge: 2021-11-11 | Disposition: A | Discharge status: Home / Self Care | Payer: BC Managed Care – PPO | Source: Ambulatory Visit | Attending: Infectious Diseases | Admitting: Infectious Diseases

## 2021-11-11 VITALS — BP 139/93 | HR 96 | Resp 16 | Ht 64.0 in | Wt 252.6 lb

## 2021-11-11 DIAGNOSIS — F909 Attention-deficit hyperactivity disorder, unspecified type: Secondary | ICD-10-CM | POA: Diagnosis not present

## 2021-11-11 DIAGNOSIS — F39 Unspecified mood [affective] disorder: Secondary | ICD-10-CM | POA: Diagnosis not present

## 2021-11-11 DIAGNOSIS — F4310 Post-traumatic stress disorder, unspecified: Secondary | ICD-10-CM | POA: Diagnosis not present

## 2021-11-11 DIAGNOSIS — L0292 Furuncle, unspecified: Secondary | ICD-10-CM | POA: Diagnosis not present

## 2021-11-11 DIAGNOSIS — A4901 Methicillin susceptible Staphylococcus aureus infection, unspecified site: Secondary | ICD-10-CM | POA: Diagnosis not present

## 2021-11-11 DIAGNOSIS — F17210 Nicotine dependence, cigarettes, uncomplicated: Secondary | ICD-10-CM | POA: Diagnosis not present

## 2021-11-11 DIAGNOSIS — L02818 Cutaneous abscess of other sites: Secondary | ICD-10-CM | POA: Insufficient documentation

## 2021-11-11 DIAGNOSIS — Z8669 Personal history of other diseases of the nervous system and sense organs: Secondary | ICD-10-CM | POA: Insufficient documentation

## 2021-11-11 DIAGNOSIS — L0291 Cutaneous abscess, unspecified: Secondary | ICD-10-CM

## 2021-11-11 DIAGNOSIS — N3944 Nocturnal enuresis: Secondary | ICD-10-CM | POA: Insufficient documentation

## 2021-11-11 DIAGNOSIS — Z8614 Personal history of Methicillin resistant Staphylococcus aureus infection: Secondary | ICD-10-CM | POA: Diagnosis not present

## 2021-11-11 DIAGNOSIS — L02413 Cutaneous abscess of right upper limb: Secondary | ICD-10-CM | POA: Diagnosis not present

## 2021-11-11 DIAGNOSIS — Z79899 Other long term (current) drug therapy: Secondary | ICD-10-CM | POA: Insufficient documentation

## 2021-11-11 LAB — SURGICAL PCR SCREEN
MRSA, PCR: NEGATIVE
Staphylococcus aureus: NEGATIVE

## 2021-11-11 MED ORDER — MUPIROCIN CALCIUM 2 % EX CREA: 1.0000 "application " | TOPICAL_CREAM | Freq: Two times a day (BID) | CUTANEOUS | 0 refills | Status: DC

## 2021-11-11 MED ORDER — CHLORHEXIDINE GLUCONATE 4 % EX SOLN: 15.0000 mL | Freq: Every day | CUTANEOUS | 0 refills | Status: DC

## 2021-11-11 NOTE — Patient Instructions (Addendum)
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 To ensure that your skin is in good condition, follow the Routine Skin Care principles below to reduce drying and enhance healing. Do not start while you have any active boils. Routine Skin Care principles to reduce drying and enhance healing: Avoid the use of soap when bathing or showering when performing this decolonization. DO NOT routinely use antiseptic solutions. If you need to use something, chose a soap substitute (examples -QV Wash or Cetaphil).  When drying with a towel, be gentle and pat dry your skin. Avoid rubbing the skin.  Reduce the overall frequency of bathing or showering. A short shower (3 minutes) is better than a bath in terms of its effect on the skin.  Use a simple sorbelene based-cream on your skin prior to showering and immediately after drying. (Examples: Hydroderm or other Sorbelene-based preparations). Especially protect healing or dry areas of skin in this way. Don't use a barrier cream with a vaseline base or with perfumes and additives. You are more likely to be allergic to these products, and cause further damage to your skin.  Make sure that you clean and cover any skin cuts or grazes that occur. Try to avoid picking or biting fingernails and the skin around the nails Keep your fingernails clipped short and clean to reduce problems caused by scratching.  For itchy skin, try gently massaging sorbelene-based cream into itchy areas instead of scratching it. A long-acting non-sedating antihistamine drug is the next option to try. However make sure that you are not taking medication that will interact with this drug type.                                                                         To  prepare yourself for your treatment, it is recommended that you complete the following steps: Remove nose, ear and other body piercing items for several days prior to the treatment and keep them out during the treatment period  Purchase a new toothbrush, disposable razor (if used), sterident for dentures (if required) and a container of alcohol hand hygiene solution (gel or rub)  Discard old toothbrushes and razors when the treatment starts. Also discard opened deodorant rollers, skin adhesive tapes, skin creams and solutions- all of these may already be contaminated with staph  Discard pumice stone(s), sponges and disposable face cloths if used   Discard all make-up brushes, creams, and implements  Discard or hot wash all fluffy toys  Wash hair brush and comb, nail files, plastic toys and cutters in the dishwasher or purchase new ones  Remove nail varnish and artificial nails  Daily routine for 5 days     *Minimize contact with members of the community during the 5 days of treatment as much as possible* Body washes The effectiveness of the program increases if the correct procedure is used: Apply the provided antiseptic body wash (4% chlorhexidine) in the shower daily  Take care to wash hair, under the  arms and into the groin and into any folds of skin  Allow the antiseptic to remain on the skin for at least 5 minutes  Nasal ointment Disinfect your hands with alcohol gel/rub and allow to dry   Open the mupirocin 2% (Bactroban) nasal ointment.  Place small amount (size of match head) of ointment onto a Q tip and massage gently around the inside of the nostril on one side, making sure not to insert it too deeply (no more than 2 cm or a little less than an inch).  Use a Q tip  for the other nostril so that you do not contaminate the Bactroban tube with staph.   After applying the ointment, press a finger against the nose next to the nostril opening and use a circular motion to spread the ointment  within the nose  Apply the mupirocin ointment two times a day for 5 days  Disinfect your hands with alcohol rub/gel after applying the ointmentp>  Personal items (combs, razor, eyeglasses, jewelry, etc.)     Disinfect all personal items daily with an alcohol-based cleanser  House Environment and Clothes/Linens On day 2 and 5 of the treatment, clean your house, (especially the bedroom and bathroom). Clean dust off all surfaces and then vacuum clean all floor surfaces AND soft furnishings (such as your favorite chair). If your chair/couch has a vinyl or leather covering then wipe over the chair with warm soapy water and then dry with a clean towel (which should then be washed). Staph lives in skin scales from humans that contaminate the environment. This can lead to re-infection.   Disinfect the shower floor and/or bath tub daily   On days 1, 3, AND 5 of the treatment wash your clothes, underwear, pajamas and bed linen (such as towels, sheets, washcloths, and bath mats). A hot wash with laundry detergent is best (there is no need to use expensive laundry detergent or powder). Dry clothes in sun if possible. Change into clean clothes or pajamas on those days after your shower.   Do not share or exchange any personal items of clothing  Sports/Gym     Surfaces, equipment and towels, and skin-to-skin contact are all potential sources for staph re-infection Pets Dogs and other companion animals can also be colonized with the same strains of MRSA. Best to wash or replace bedding material for the animal and wash the animal at least once during the treatment period with antiseptic solution (2% chlorhexidine wash). Ensure that the skin of the animal is kept in good condition. If the pet has any chronic skin disorders, consult your vet prior to starting your treatment process. What about my partner, family, or household members? Usually when an aggressive strain of staph moves in to a family or household, only  certain members of that group get infections (boils). This is despite the fact that the strain has probably transferred itself from person to person within the group. Those without boils may also be carrying the bacterium, however they must have better resistance (immunity) or perhaps have better skin condition. Staph likes to invade through cuts, scratches and skin with dermatitis or dryness.    Follow-up after decolonization treatment  Possible approaches will vary depending on how your treatment goes. Your provider will instruct you on the follow-up best suited for you. Some options are:  Wait and see - if no further boils occur within 6 months then it is probable that the strain of staph has been eliminated from you.  Continue intermittent body washes 1-2 times per week with 2% aqueous chlorhexidine soap preparation or similar   Future antibiotic use  The best preventative approach to avoiding future problems may be to avoid use of antibiotics unless there is a strong indication. Antibiotics are often prescribed for minor infections or for respiratory infections that are mostly due to viruses. It is in your best interest to ride these infections out rather than taking antibiotics. Taking antibiotics alters your natural bacterial flora on your skin and in your gut. This may reduce your resistance against acquiring a resistant bacterial strain such as MRSA).   Important note: if you do become very ill with possible infection and require hospital review, it is important for you to remind your medical care providers that you have been colonized with MRSA in the past as they may have to use antibiotics that are active against the strain that you had previously.   References Wiese-Posselt et al. Clin Inf Dis [id number]  CDC:  [employer]se.html   Bleach baths  Take a bleach baths twice a week for at least 15 minutes with any kind of soap for 3 weeks. Bleach baths can be  a good treatment for people who do not have broken skin or eczema. They can help prevent MRSA from coming back. Use lotion after the bath, because a bleach bath can dry out the skin.    How much bleach to put in: an average full bathtub (adult level) holds about 40 gallons of water, add 1/4 cup bleach for each 1/4 fill of the bathtub.

## 2021-11-11 NOTE — Progress Notes (Signed)
NAME: Diane James  DOB: Aug 18, 1986  MRN: [id number]  Date/Time: 11/11/2021 9:26 AM   ? Diane James is a 35 y.o. female with a history of ADHD , c PTSD, mood disorder followed by is here for recurrent skin abscesses /boils going on for years .She says she has been to urgent care many times, and no one has focused on getting to the bottom of her condition. She also gets styes and infections in her eyelash.  She has seen ophthalmologist before September 2022 culture was taken and had Staphylococcus aureus She recently went to the ED for boil on her right forearm and abdomen area.  No cultures were sent.  She was asked to continue the clindamycin she was taking and was referred to me. Patient does say that she sometimes squeezes the hair follicle when she sees a hair coming out. And then I told her that she should not be doing that she was initially very upset and frustrated that I was focusing on her not squeezing the lesions like other doctors.  So far she had been going away to urgent care but now she has insurance and she has signed up for her PCP.  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 She is a current smoker.  Past Medical History:  Diagnosis Date   ADHD (attention deficit hyperactivity disorder)    Anxiety    Bipolar disorder (HCC)    Chicken pox    Depression    Enuresis    Headache    Migraines    Neuromuscular disorder (HCC)     Past Surgical History:  Procedure Laterality Date   DILATION AND CURETTAGE OF UTERUS     GANGLION CYST EXCISION     left wrist x3   OVARIAN CYST REMOVAL     right ovary    Social History   Socioeconomic History   Marital status: Married    Spouse name: Not on file   Number of children: Not on file   Years of education: Not on file   Highest education level: Not on file  Occupational History    Not on file  Tobacco Use   Smoking status: Every Day    Packs/day: 1.00    Years: 7.00    Pack years: 7.00    Types: Cigarettes   Smokeless tobacco: Never  Substance and Sexual Activity   Alcohol use: No   Drug use: No   Sexual activity: Yes  Other Topics Concern   Not on file  Social History Narrative   Not on file   Social Determinants of Health   Financial Resource Strain: Not on file  Food Insecurity: Not on file  Transportation Needs: Not on file  Physical Activity: Not on file  Stress: Not on file  Social Connections: Not on file  Intimate Partner Violence: Not on file    Family History  Problem Relation Age of Onset   Fibromyalgia Mother    Cancer Maternal Grandmother        breast cancer   Anxiety disorder Father    Depression Father    ADD / ADHD Brother    ADD / ADHD Brother    Bipolar disorder Paternal Uncle    Allergies  Allergen Reactions   Poison Ivy Extract Anaphylaxis   Poison Oak Extract Anaphylaxis   Morphine Hives  Silvadene [Silver Sulfadiazine] Hives    burning   I? Current Outpatient Medications  Medication Sig Dispense Refill   acetaminophen (TYLENOL) 325 MG tablet Take by mouth.     clindamycin (CLEOCIN) 300 MG capsule Take 300 mg by mouth 4 (four) times daily.     ibuprofen (ADVIL) 800 MG tablet Take 1 tablet (800 mg total) by mouth every 8 (eight) hours as needed for moderate pain. 15 tablet 0   lisdexamfetamine (VYVANSE) 40 MG capsule Take by mouth.     mupirocin ointment (BACTROBAN) 2 % Apply 1 application topically 2 (two) times daily. 22 g 0   desmopressin (DDAVP) 0.2 MG tablet TAKE 3 TABLETS (600 MCG TOTAL) BY MOUTH AT BEDTIME. (Patient not taking: Reported on 11/11/2021) 90 tablet 3   oxyCODONE-acetaminophen (PERCOCET/ROXICET) 5-325 MG tablet Take 1 tablet by mouth every 4 (four) hours as needed for severe pain. (Patient not taking: Reported on 11/11/2021) 15 tablet 0   pantoprazole (PROTONIX) 20 MG tablet Take 1 tablet (20 mg  total) by mouth daily. (Patient not taking: Reported on 11/11/2021) 30 tablet 1   No current facility-administered medications for this visit.     Abtx:  Anti-infectives (From admission, onward)    None       REVIEW OF SYSTEMS:  Const: negative fever, negative chills, negative weight loss Eyes: negative diplopia or visual changes, negative eye pain ENT: negative coryza, negative sore throat Resp: negative cough, hemoptysis, dyspnea Cards: negative for chest pain, palpitations, lower extremity edema GU: negative for frequency, dysuria and hematuria GI: Negative for abdominal pain, diarrhea, bleeding, constipation Skin: Multiple lesions scattered over her body. heme: negative for easy bruising and gum/nose bleeding MS: negative for myalgias, arthralgias, back pain and muscle weakness Neurolo:negative for headaches, dizziness, vertigo, memory problems  Psych: negative for feelings of anxiety, depression  Endocrine: negative for thyroid, diabetes Allergy/Immunology-as above Objective:  VITALS:  BP (!) 139/93   Pulse 96   Resp 16   Ht 5' 4" (1.626 m)   Wt 252 lb 9.6 oz (114.6 kg)   LMP 10/17/2021   SpO2 96%   BMI 43.36 kg/m  PHYSICAL EXAM:  General: Alert, cooperative, no distress, appears stated age.  Head: Normocephalic, without obvious abnormality, atraumatic. Eyes: Conjunctivae clear, anicteric sclerae. Pupils are equal ENT Nares normal. No drainage or sinus tenderness. Lips, mucosa, and tongue normal. No Thrush Neck: Supple, symmetrical, no adenopathy, thyroid: non tender no carotid bruit and no JVD. Back: No CVA tenderness. Lungs: Clear to auscultation bilaterally. No Wheezing or Rhonchi. No rales. Heart: Regular rate and rhythm, no murmur, rub or gallop. Abdomen: Soft, non-tender,not distended. Bowel sounds normal. No masses Extremities: atraumatic, no cyanosis. No edema. No clubbing Skin: Papular nodular eruptions over her face.  Couple of them are  excoriated. Under the axilla superficial excoriated lesions. No tracts or deep scar suggestive of hidradenitis suppurativa. On the right forearm there is a small boil which has been deroofed. The one on the left side of her abdomen is almost healed. Lymph: Cervical, supraclavicular normal. Neurologic: Grossly non-focal Pertinent Labs Lab Results 08/24/2021 HbA1c is 5.4. CBC is normal ESR is 25 Aerobic culture on 08/26/2021 is MSSA.   Impression/Recommendation Recurrent boils and small papular lesions for the past 2 years.  She also has had a hordeolum externum and blepharitis.  MSSA in the culture taken in September.  She says it was from the eye.  She has been on multiple courses of antibiotics including doxycycline, clindamycin Augmentin.  Today she  has a healing abscess on the right forearm and abdomen. I will send a culture from this to see whether she has MSSA or MRSA.  We will also do nares swab for screening. She is likely colonized with staph aureus.  In the past she has had MSSA.  Need to rule out MRSA. Because of recurrent infections we need to decolonize her.  Gave her instructions regarding decolonization process and maintenance of personal, environmental hygiene.  See AVS. Will follow up with the culture result of the right forearm as well as the nasal swab Sent prescription for chlorhexidine 4% and mupirocin. Will get in touch with her once I have the culture results and evaluate whether she needs antibiotics.   Patient is a current smoker.  Discussed quitting strategies and also the risk of worsening skin lesion secondary to smoking.  History of hordeolum externum, blepharitis/purulent conjunctivitis.  None at the present.  Explained techniques to reduce the flaking and scaling eyelids and keeping this eyelid and eyelashes 3-year-old scales.  History of nocturnal enuresis.  Takes desmopressin.  _____________________ Discussed with patient in great detail. Spent nearly 40  minutes with the counseling and management plan.     Note:  This document was prepared using Dragon voice recognition software and may include unintentional dictation errors.

## 2021-11-14 LAB — AEROBIC CULTURE W GRAM STAIN (SUPERFICIAL SPECIMEN)
Culture: NO GROWTH
Gram Stain: NONE SEEN

## 2021-11-18 ENCOUNTER — Encounter: Payer: Self-pay | Admitting: Infectious Diseases

## 2021-11-19 ENCOUNTER — Telehealth: Payer: Self-pay

## 2021-11-19 NOTE — Telephone Encounter (Signed)
Spoke to patient and advised Arm and Nasal cultures both Negative. Asked if she has done the decolonization protocol on her AVS. She states she was told not to do that until she got results back on cultures and also told she will be treated with oral abx even if the culture swabs are Negative.

## 2021-11-23 DIAGNOSIS — F5105 Insomnia due to other mental disorder: Secondary | ICD-10-CM | POA: Diagnosis not present

## 2021-11-23 DIAGNOSIS — F4310 Post-traumatic stress disorder, unspecified: Secondary | ICD-10-CM | POA: Diagnosis not present

## 2021-11-23 DIAGNOSIS — F39 Unspecified mood [affective] disorder: Secondary | ICD-10-CM | POA: Diagnosis not present

## 2021-11-23 DIAGNOSIS — F900 Attention-deficit hyperactivity disorder, predominantly inattentive type: Secondary | ICD-10-CM | POA: Diagnosis not present

## 2021-12-14 ENCOUNTER — Ambulatory Visit: Payer: BC Managed Care – PPO | Admitting: Dermatology

## 2021-12-28 DIAGNOSIS — F900 Attention-deficit hyperactivity disorder, predominantly inattentive type: Secondary | ICD-10-CM | POA: Diagnosis not present

## 2021-12-28 DIAGNOSIS — F5105 Insomnia due to other mental disorder: Secondary | ICD-10-CM | POA: Diagnosis not present

## 2021-12-28 DIAGNOSIS — F4310 Post-traumatic stress disorder, unspecified: Secondary | ICD-10-CM | POA: Diagnosis not present

## 2022-01-07 IMAGING — CT CT HEAD W/O CM
3 series · 16 of 47 positions shown, 19 images · non-contrast
Comparison: None.

CLINICAL DATA: Headache

EXAM:
CT HEAD WITHOUT CONTRAST
TECHNIQUE: Contiguous axial images were obtained from the base of the skull
through the vertex without intravenous contrast.

[Series 3: head wo · axial · 0.43mm/px · z∈[-97,+28]mm · 10 of 31 slices shown, 13 images]
[im 3/31  brain]
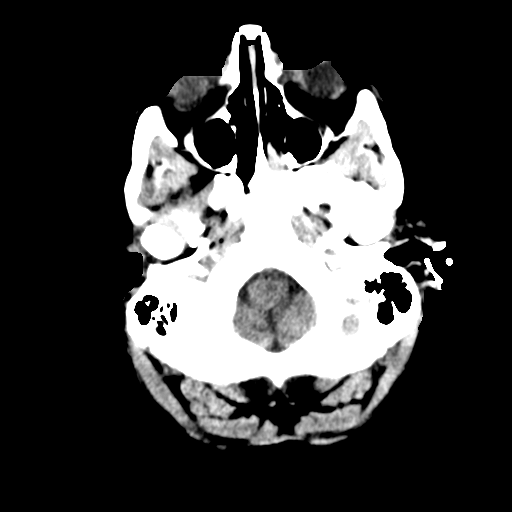
[im 3/31  bone]
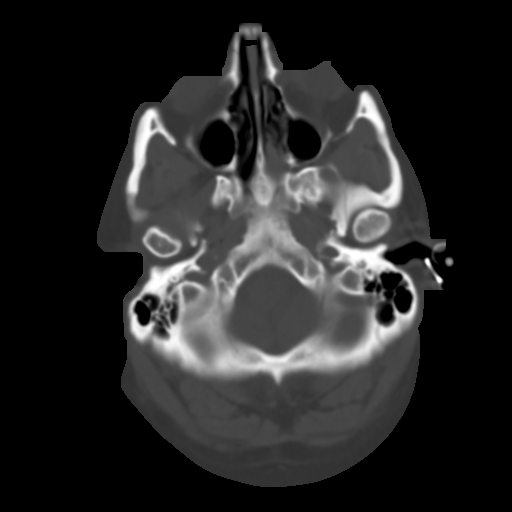
[im 6/31  brain]
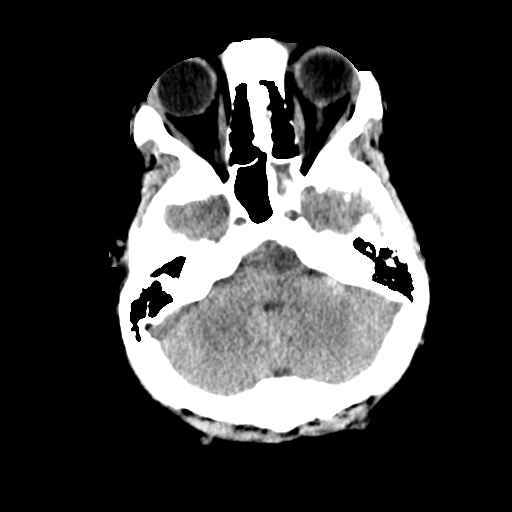
[im 9/31  brain]
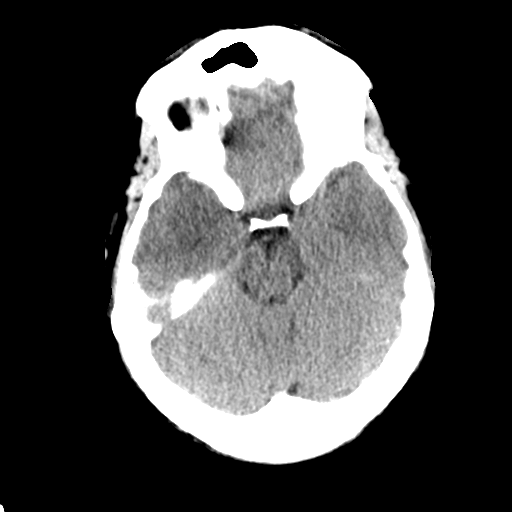
[im 11/31  brain]
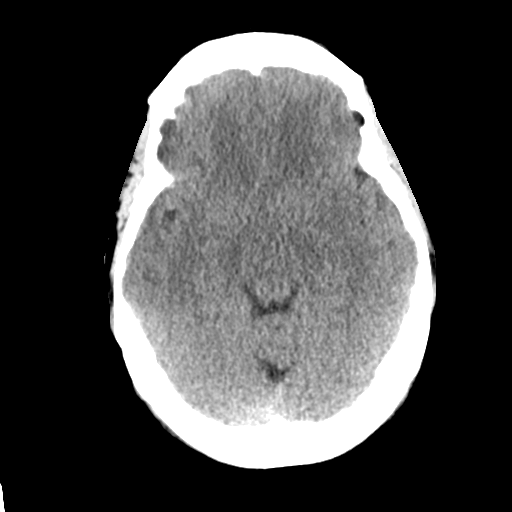
[im 14/31  brain]
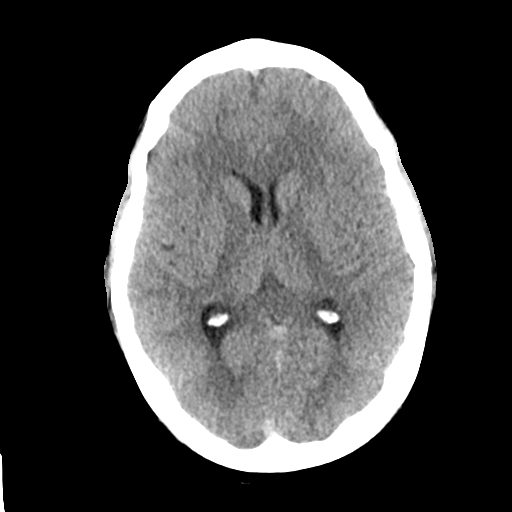
[im 14/31  bone]
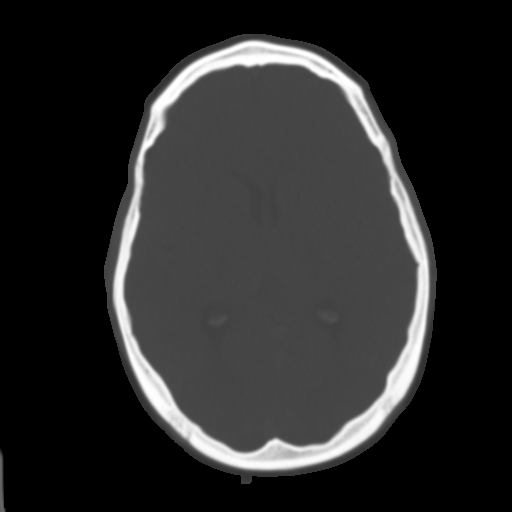
[im 17/31  brain]
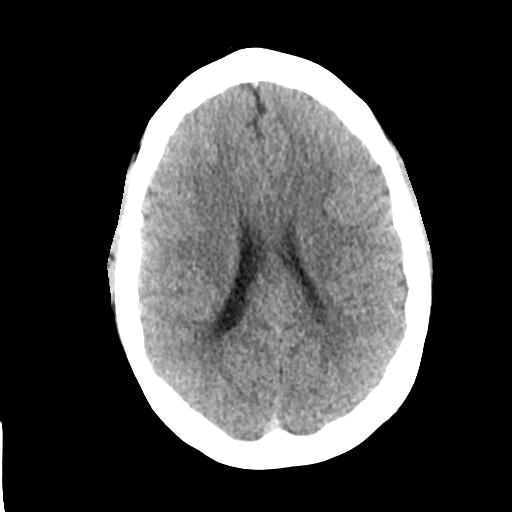
[im 20/31  brain]
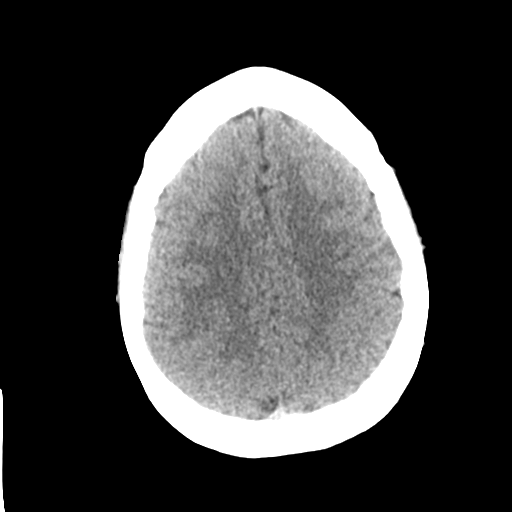
[im 23/31  brain]
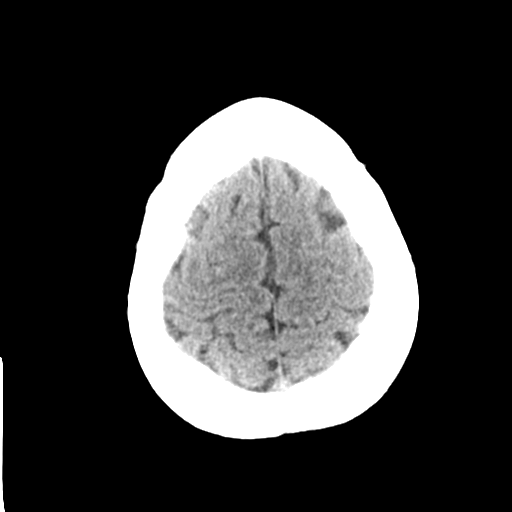
[im 25/31  brain]
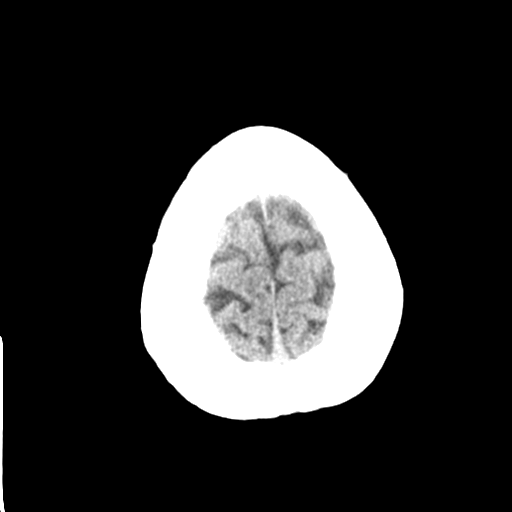
[im 25/31  bone]
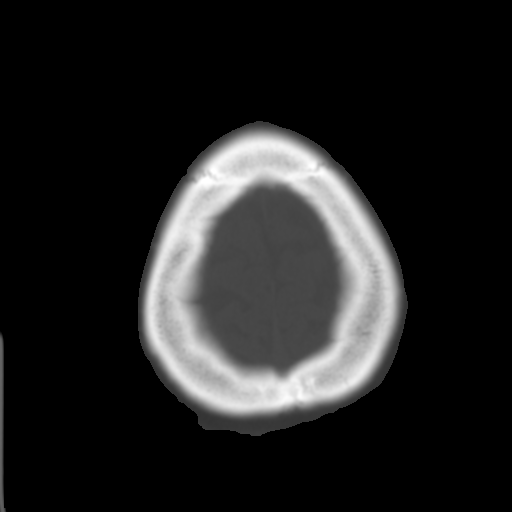
[im 28/31  brain]
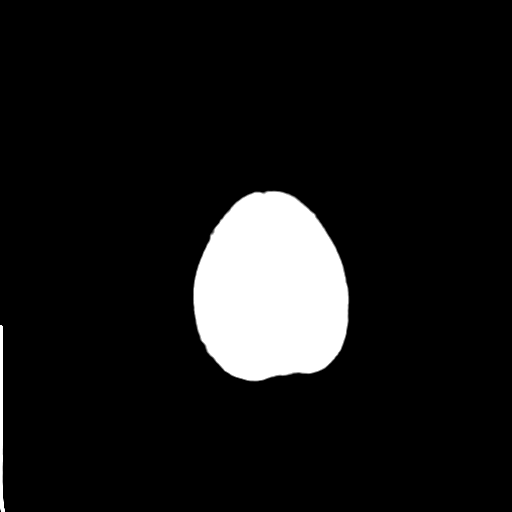

[Series 4: coronal soft tissue · coronal · 0.29mm/px · 3 of 66 slices shown]
[im 23/66  brain]
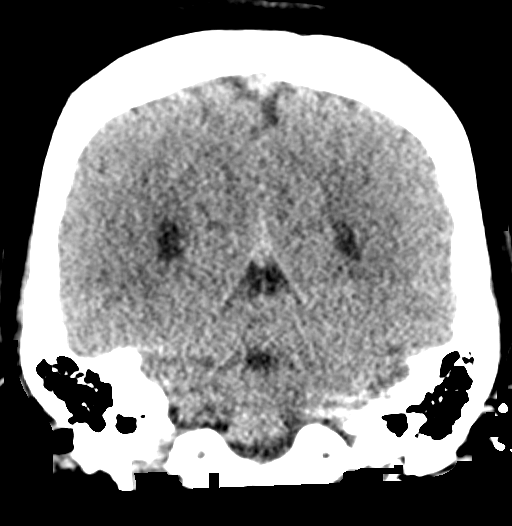
[im 30/66  brain]
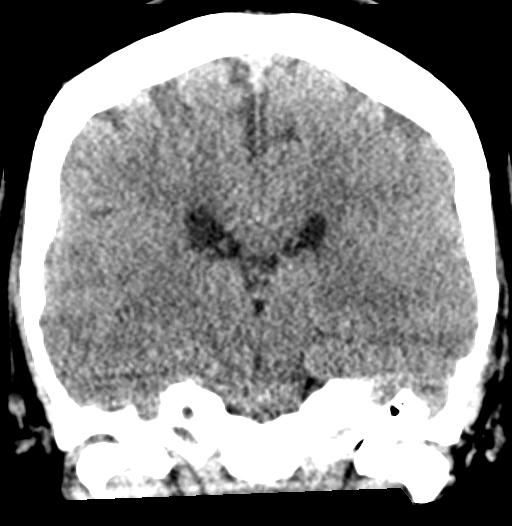
[im 36/66  brain]
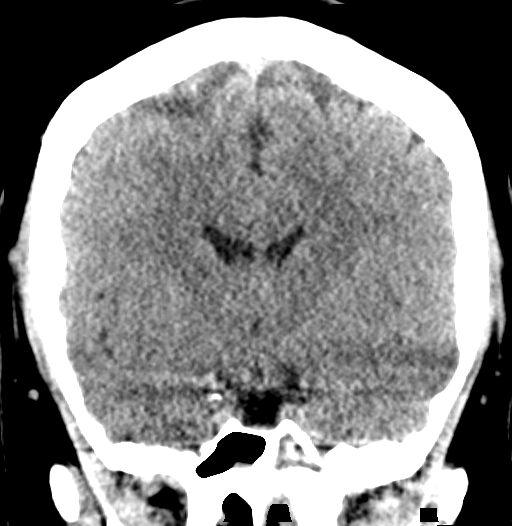

[Series 5: sagittal soft tissue · sagittal · 0.30mm/px · 3 of 50 slices shown]
[im 17/50  brain]
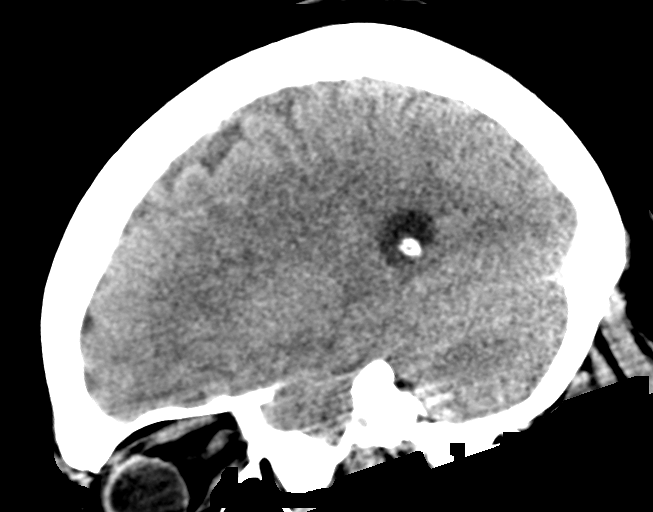
[im 25/50  brain]
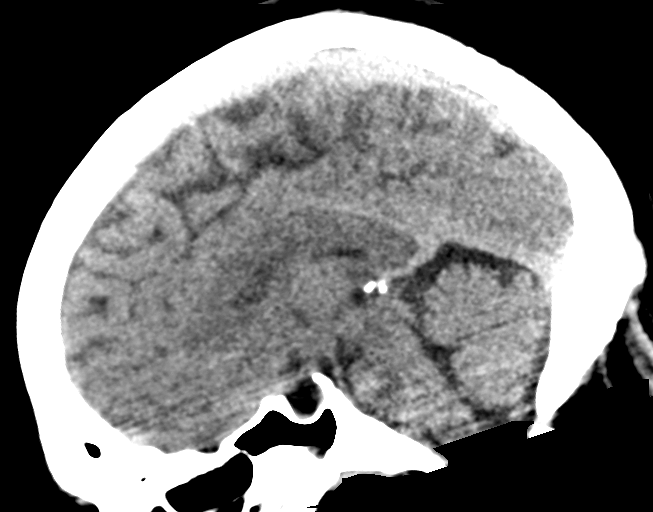
[im 33/50  brain]
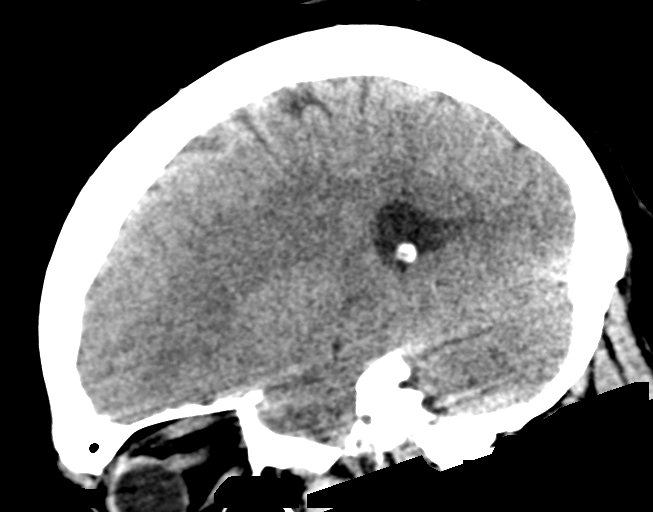

[16 of 47 positions shown; findings below may reference images not displayed]

FINDINGS: Brain: Normal anatomic configuration. No abnormal intra or
extra-axial mass lesion or fluid collection. No abnormal mass effect
or midline shift. No evidence of acute intracranial hemorrhage or
infarct. Ventricular size is normal. Cerebellum unremarkable.

Vascular: Unremarkable

Skull: Intact

Sinuses/Orbits: There is complete opacification of the left sphenoid
sinus with central hyperdensity suggesting chronic or fungal
sinusitis. Remaining paranasal sinuses are clear. Orbits are
unremarkable.

Other: Mastoid air cells and middle ear cavities are clear.
IMPRESSION: No acute intracranial abnormality.

Chronic or fungal left sphenoid sinusitis.

## 2022-01-25 DIAGNOSIS — F4310 Post-traumatic stress disorder, unspecified: Secondary | ICD-10-CM | POA: Diagnosis not present

## 2022-01-25 DIAGNOSIS — F900 Attention-deficit hyperactivity disorder, predominantly inattentive type: Secondary | ICD-10-CM | POA: Diagnosis not present

## 2022-01-25 DIAGNOSIS — F39 Unspecified mood [affective] disorder: Secondary | ICD-10-CM | POA: Diagnosis not present

## 2022-02-22 DIAGNOSIS — F900 Attention-deficit hyperactivity disorder, predominantly inattentive type: Secondary | ICD-10-CM | POA: Diagnosis not present

## 2022-02-22 DIAGNOSIS — F4310 Post-traumatic stress disorder, unspecified: Secondary | ICD-10-CM | POA: Diagnosis not present

## 2022-02-22 DIAGNOSIS — F39 Unspecified mood [affective] disorder: Secondary | ICD-10-CM | POA: Diagnosis not present

## 2022-03-22 DIAGNOSIS — F900 Attention-deficit hyperactivity disorder, predominantly inattentive type: Secondary | ICD-10-CM | POA: Diagnosis not present

## 2022-03-22 DIAGNOSIS — F4310 Post-traumatic stress disorder, unspecified: Secondary | ICD-10-CM | POA: Diagnosis not present

## 2022-03-22 DIAGNOSIS — F39 Unspecified mood [affective] disorder: Secondary | ICD-10-CM | POA: Diagnosis not present

## 2022-04-21 ENCOUNTER — Encounter: Payer: Self-pay | Admitting: Emergency Medicine

## 2022-04-21 ENCOUNTER — Ambulatory Visit
Admission: EM | Admit: 2022-04-21 | Discharge: 2022-04-21 | Disposition: A | Discharge status: Home / Self Care | Payer: BC Managed Care – PPO | Attending: Emergency Medicine | Admitting: Emergency Medicine

## 2022-04-21 DIAGNOSIS — M5431 Sciatica, right side: Secondary | ICD-10-CM

## 2022-04-21 MED ORDER — OXYCODONE-ACETAMINOPHEN 5-325 MG PO TABS: 1.0000 | ORAL_TABLET | Freq: Four times a day (QID) | ORAL | 0 refills | Status: DC | PRN

## 2022-04-21 MED ORDER — PREDNISONE 10 MG (21) PO TBPK: ORAL_TABLET | ORAL | 0 refills | Status: DC

## 2022-04-21 NOTE — ED Triage Notes (Signed)
Right sciatica acting up, been unable to sleep for 4 days. Nauseated from pain ?

## 2022-04-21 NOTE — Discharge Instructions (Addendum)
Take the prednisone according to the package instructions.  You will take it each morning with breakfast.  Make sure that you are always taking it with food. ? ?Take the Percocet at bedtime to help with pain and so he can get some sleep. ? ?Follow the rehabilitation exercises in your discharge paperwork to help you with your sciatic pain. ? ?Return for reevaluation for new or worsening symptoms, or see your primary care provider.  ?

## 2022-04-21 NOTE — ED Provider Notes (Signed)
?[facility] ? ? ? ?CSN: [id number] ?Arrival date & time: 04/21/22  1910 ? ? ?  ? ?History   ?Chief Complaint ?Chief Complaint  ?Patient presents with  ? Sciatica  ? ? ?HPI ?Diane James is a 36 y.o. female.  ? ?HPI ? ?36 year old female here for evaluation of right low back and leg pain. ? ?Patient reports that she has a longstanding history of sciatica and it has been acting up on her right side for the last 2 weeks.  It starts in her low back, radiates down through her buttock, and down the outside and back of her leg into her foot.  She has been using massage, Tylenol, ibuprofen, and Aleve without any relief of symptoms.  She states that the pain has kept her up and she is not really getting sleep for the past 4 nights and is now beginning to make her nauseous.  She states that typically she is able to get it to release with home physical therapy but this time it has not helped and she is never had pain to this degree before. ? ?Past Medical History:  ?Diagnosis Date  ? ADHD (attention deficit hyperactivity disorder)   ? Anxiety   ? Bipolar disorder (HCC)   ? Chicken pox   ? Depression   ? Enuresis   ? Headache   ? Migraines   ? Neuromuscular disorder (HCC)   ? ? ?Patient Active Problem List  ? Diagnosis Date Noted  ? Obesity, Class III, BMI 40-49.9 (morbid obesity) (HCC) 03/30/2016  ? Enuresis 03/30/2016  ? Allergic rhinitis 03/30/2016  ? ADD (attention deficit disorder) 03/30/2016  ? Other specified abnormal immunological findings in serum 11/14/2013  ? Anxiety and depression 03/14/2013  ? Bipolar I disorder (HCC) 03/14/2013  ? Lumbar strain 04/30/2012  ? ? ?Past Surgical History:  ?Procedure Laterality Date  ? DILATION AND CURETTAGE OF UTERUS    ? GANGLION CYST EXCISION    ? left wrist x3  ? OVARIAN CYST REMOVAL    ? right ovary  ? ? ?OB History   ? ? Gravida  ?6  ? Para  ?3  ? Term  ?2  ? Preterm  ?1  ? AB  ?2  ? Living  ?2  ?  ? ? SAB  ?2  ? IAB  ?   ? Ectopic  ?   ? Multiple  ?   ? Live  Births  ?   ?   ?  ? Obstetric Comments  ?3rd pregnancy was stillborn  ?  ? ?  ? ? ? ?Home Medications   ? ?Prior to Admission medications   ?Medication Sig Start Date End Date Taking? Authorizing Provider  ?oxyCODONE-acetaminophen (PERCOCET/ROXICET) 5-325 MG tablet Take 1 tablet by mouth every 6 (six) hours as needed for severe pain. 04/21/22  Yes Becky Augustayan, In[address]vena, NP  ?predniSONE (STERAPRED UNI-PAK 21 TAB) 10 MG (21) TBPK tablet Take 6 tablets on day 1, 5 tablets day 2, 4 tablets day 3, 3 tablets day 4, 2 tablets day 5, 1 tablet day 6 04/21/22  Yes Becky Augustayan, Marea Reasner, NP  ?acetaminophen (TYLENOL) 325 MG tablet Take by mouth.    [provider]  ?Chlorhexidine Gluconate 4 % SOLN Apply 15 mLs topically daily. 11/11/21   Lynn Itoavishankar, Jayashree, MD  ?clindamycin (CLEOCIN) 300 MG capsule Take 300 mg by mouth 4 (four) times daily.    [provider]  ?ibuprofen (ADVIL) 800 MG tablet Take 1 tablet (800 mg total)  by mouth every 8 (eight) hours as needed for moderate pain. 11/04/21   Irean Hong, MD  ?lisdexamfetamine (VYVANSE) 40 MG capsule Take by mouth. 10/12/21   [provider]  ?mupirocin cream (BACTROBAN) 2 % Apply 1 application topically 2 (two) times daily. 11/11/21   Lynn Ito, MD  ?mupirocin ointment (BACTROBAN) 2 % Apply 1 application topically 2 (two) times daily. 11/02/21   Shirlee Latch, PA-C  ? ? ?Family History ?Family History  ?Problem Relation Age of Onset  ? Fibromyalgia Mother   ? Cancer Maternal Grandmother   ?     breast cancer  ? Anxiety disorder Father   ? Depression Father   ? ADD / ADHD Brother   ? ADD / ADHD Brother   ? Bipolar disorder Paternal Uncle   ? ? ?Social History ?Social History  ? ?Tobacco Use  ? Smoking status: Every Day  ?  Packs/day: 1.00  ?  Years: 7.00  ?  Pack years: 7.00  ?  Types: Cigarettes  ? Smokeless tobacco: Never  ?Substance Use Topics  ? Alcohol use: No  ? Drug use: No  ? ? ? ?Allergies   ?Poison ivy extract, Poison oak extract, Morphine, and  Silvadene [silver sulfadiazine] ? ? ?Review of Systems ?Review of Systems  ?Constitutional:  Negative for fever.  ?Gastrointestinal:  Positive for nausea. Negative for vomiting.  ?Musculoskeletal:  Positive for back pain.  ?Skin:  Negative for rash.  ?Neurological:  Negative for weakness and numbness.  ?Hematological: Negative.   ?Psychiatric/Behavioral: Negative.    ? ? ?Physical Exam ?Triage Vital Signs ?ED Triage Vitals  ?Enc Vitals Group  ?   BP 04/21/22 1944 123/86  ?   Pulse Rate 04/21/22 1944 88  ?   Resp 04/21/22 1944 16  ?   Temp 04/21/22 1944 98.4 ?F (36.9 ?C)  ?   Temp Source 04/21/22 1944 Oral  ?   SpO2 04/21/22 1944 98 %  ?   Weight --   ?   Height 04/21/22 1942 5\' 4"  (1.626 m)  ?   Head Circumference --   ?   Peak Flow --   ?   Pain Score 04/21/22 1942 10  ?   Pain Loc --   ?   Pain Edu? --   ?   Excl. in GC? --   ? ?No data found. ? ?Updated Vital Signs ?BP 123/86 (BP Location: Left Arm)   Pulse 88   Temp 98.4 ?F (36.9 ?C) (Oral)   Resp 16   Ht 5\' 4"  (1.626 m)   LMP 03/26/2022   SpO2 98%   BMI 43.36 kg/m?  ? ?Visual Acuity ?Right Eye Distance:   ?Left Eye Distance:   ?Bilateral Distance:   ? ?Right Eye Near:   ?Left Eye Near:    ?Bilateral Near:    ? ?Physical Exam ?Vitals and nursing note reviewed.  ?Constitutional:   ?   Appearance: Normal appearance. She is not ill-appearing.  ?HENT:  ?   Head: Normocephalic and atraumatic.  ?Musculoskeletal:     ?   General: Tenderness present. No swelling, deformity or signs of injury.  ?Skin: ?   General: Skin is warm and dry.  ?   Capillary Refill: Capillary refill takes less than 2 seconds.  ?   Findings: No bruising or erythema.  ?Neurological:  ?   General: No focal deficit present.  ?   Mental Status: She is alert and oriented to person, place, and  time.  ?   Sensory: No sensory deficit.  ?Psychiatric:     ?   Mood and Affect: Mood normal.     ?   Behavior: Behavior normal.     ?   Thought Content: Thought content normal.     ?   Judgment: Judgment  normal.  ? ? ? ?UC Treatments / Results  ?Labs ?(all labs ordered are listed, but only abnormal results are displayed) ?Labs Reviewed - No data to display ? ?EKG ? ? ?Radiology ?No results found. ? ?Procedures ?Procedures (including critical care time) ? ?Medications Ordered in UC ?Medications - No data to display ? ?Initial Impression / Assessment and Plan / UC Course  ?I have reviewed the triage vital signs and the nursing notes. ? ?Pertinent labs & imaging results that were available during my care of the patient were reviewed by me and considered in my medical decision making (see chart for details). ? ?Patient is a nontoxic-appearing 36 year old female who does appear to be in mild degree of pain here for evaluation of static flare on the right side that is been going on for the past 2 weeks as outlined HPI above.  On exam patient has normal axial carriage but she is unable to sit down secondary to the pain in her right low back, buttock, and down the back of her leg.  Patient has no tenderness with palpation of the lumbar spine in the midline but she does have tenderness with palpation of the inferior aspect of the right paralumbar spinous region.  She also has tenderness with palpation along the path of the sciatic nerve down through the right buttock and down the outside of the leg on the right side.  Her DP and PT pulse in her right foot are 2+.  She does have a positive straight leg raise on the right as well.  Patient Damas consistent with sciatica.  I will start her on a prednisone pack tomorrow morning and give her some Percocet to bridge her pain for tonight to help her get some sleep.  I will also give home physical therapy exercises for her to try as they may be different from when she has been doing.  I have consulted PMP and she did receive a small prescription for Percocet back in November 2022.  She does receive Vyvanse monthly for her ADD. ? ? ?Final Clinical Impressions(s) / UC Diagnoses   ? ?Final diagnoses:  ?Sciatica of right side  ? ? ? ?Discharge Instructions   ? ?  ?Take the prednisone according to the package instructions.  You will take it each morning with breakfast.  Make sure that you ar

## 2022-05-19 DIAGNOSIS — M545 Low back pain, unspecified: Secondary | ICD-10-CM | POA: Diagnosis not present

## 2022-05-19 DIAGNOSIS — M5416 Radiculopathy, lumbar region: Secondary | ICD-10-CM | POA: Diagnosis not present

## 2022-05-19 DIAGNOSIS — M47816 Spondylosis without myelopathy or radiculopathy, lumbar region: Secondary | ICD-10-CM | POA: Diagnosis not present

## 2022-05-19 DIAGNOSIS — M4316 Spondylolisthesis, lumbar region: Secondary | ICD-10-CM | POA: Diagnosis not present

## 2022-05-25 DIAGNOSIS — M48061 Spinal stenosis, lumbar region without neurogenic claudication: Secondary | ICD-10-CM | POA: Diagnosis not present

## 2022-05-25 DIAGNOSIS — M5416 Radiculopathy, lumbar region: Secondary | ICD-10-CM | POA: Diagnosis not present

## 2022-05-25 DIAGNOSIS — M545 Low back pain, unspecified: Secondary | ICD-10-CM | POA: Diagnosis not present

## 2022-05-25 DIAGNOSIS — M4316 Spondylolisthesis, lumbar region: Secondary | ICD-10-CM | POA: Diagnosis not present

## 2022-05-26 DIAGNOSIS — L732 Hidradenitis suppurativa: Secondary | ICD-10-CM | POA: Diagnosis not present

## 2022-05-26 DIAGNOSIS — L700 Acne vulgaris: Secondary | ICD-10-CM | POA: Diagnosis not present

## 2022-05-26 DIAGNOSIS — L249 Irritant contact dermatitis, unspecified cause: Secondary | ICD-10-CM | POA: Diagnosis not present

## 2022-05-26 DIAGNOSIS — L918 Other hypertrophic disorders of the skin: Secondary | ICD-10-CM | POA: Diagnosis not present

## 2022-06-14 DIAGNOSIS — R109 Unspecified abdominal pain: Secondary | ICD-10-CM | POA: Diagnosis not present

## 2022-06-14 DIAGNOSIS — R10812 Left upper quadrant abdominal tenderness: Secondary | ICD-10-CM | POA: Diagnosis not present

## 2022-06-14 DIAGNOSIS — F909 Attention-deficit hyperactivity disorder, unspecified type: Secondary | ICD-10-CM | POA: Diagnosis not present

## 2022-06-14 DIAGNOSIS — F419 Anxiety disorder, unspecified: Secondary | ICD-10-CM | POA: Diagnosis not present

## 2022-06-14 DIAGNOSIS — R1013 Epigastric pain: Secondary | ICD-10-CM | POA: Diagnosis not present

## 2022-06-14 DIAGNOSIS — R110 Nausea: Secondary | ICD-10-CM | POA: Diagnosis not present

## 2022-06-14 DIAGNOSIS — R1012 Left upper quadrant pain: Secondary | ICD-10-CM | POA: Diagnosis not present

## 2022-06-14 DIAGNOSIS — Z6841 Body Mass Index (BMI) 40.0 and over, adult: Secondary | ICD-10-CM | POA: Diagnosis not present

## 2022-06-15 DIAGNOSIS — R1012 Left upper quadrant pain: Secondary | ICD-10-CM | POA: Diagnosis not present

## 2022-06-15 DIAGNOSIS — R109 Unspecified abdominal pain: Secondary | ICD-10-CM | POA: Diagnosis not present

## 2022-06-15 DIAGNOSIS — R1013 Epigastric pain: Secondary | ICD-10-CM | POA: Diagnosis not present

## 2022-06-19 DIAGNOSIS — R109 Unspecified abdominal pain: Secondary | ICD-10-CM | POA: Diagnosis not present

## 2022-06-21 DIAGNOSIS — F900 Attention-deficit hyperactivity disorder, predominantly inattentive type: Secondary | ICD-10-CM | POA: Diagnosis not present

## 2022-08-26 ENCOUNTER — Other Ambulatory Visit: Payer: Self-pay

## 2022-08-26 ENCOUNTER — Emergency Department (HOSPITAL_COMMUNITY): Payer: BC Managed Care – PPO

## 2022-08-26 ENCOUNTER — Emergency Department (HOSPITAL_COMMUNITY)
Admission: EM | Admit: 2022-08-26 | Discharge: 2022-08-26 | Disposition: A | Discharge status: Home / Self Care | Payer: BC Managed Care – PPO | Attending: Emergency Medicine | Admitting: Emergency Medicine

## 2022-08-26 DIAGNOSIS — N9489 Other specified conditions associated with female genital organs and menstrual cycle: Secondary | ICD-10-CM | POA: Insufficient documentation

## 2022-08-26 DIAGNOSIS — R000 Tachycardia, unspecified: Secondary | ICD-10-CM | POA: Diagnosis not present

## 2022-08-26 DIAGNOSIS — M25552 Pain in left hip: Secondary | ICD-10-CM | POA: Diagnosis not present

## 2022-08-26 DIAGNOSIS — M545 Low back pain, unspecified: Secondary | ICD-10-CM | POA: Insufficient documentation

## 2022-08-26 DIAGNOSIS — Y92410 Unspecified street and highway as the place of occurrence of the external cause: Secondary | ICD-10-CM | POA: Insufficient documentation

## 2022-08-26 DIAGNOSIS — I10 Essential (primary) hypertension: Secondary | ICD-10-CM | POA: Diagnosis not present

## 2022-08-26 DIAGNOSIS — T1490XA Injury, unspecified, initial encounter: Secondary | ICD-10-CM | POA: Diagnosis not present

## 2022-08-26 DIAGNOSIS — G4489 Other headache syndrome: Secondary | ICD-10-CM | POA: Diagnosis not present

## 2022-08-26 DIAGNOSIS — R519 Headache, unspecified: Secondary | ICD-10-CM | POA: Diagnosis not present

## 2022-08-26 LAB — COMPREHENSIVE METABOLIC PANEL
ALT: 17 U/L (ref 0–44)
AST: 18 U/L (ref 15–41)
Albumin: 3.9 g/dL (ref 3.5–5.0)
Alkaline Phosphatase: 69 U/L (ref 38–126)
Anion gap: 11 (ref 5–15)
BUN: 8 mg/dL (ref 6–20)
CO2: 20 mmol/L — ABNORMAL LOW (ref 22–32)
Calcium: 9.6 mg/dL (ref 8.9–10.3)
Chloride: 108 mmol/L (ref 98–111)
Creatinine, Ser: 0.71 mg/dL (ref 0.44–1.00)
GFR, Estimated: >60 mL/min (ref >60)
Glucose, Bld: 97 mg/dL (ref 70–99)
Potassium: 3.8 mmol/L (ref 3.5–5.1)
Sodium: 139 mmol/L (ref 135–145)
Total Bilirubin: 0.5 mg/dL (ref 0.3–1.2)
Total Protein: 7 g/dL (ref 6.5–8.1)

## 2022-08-26 LAB — CBC
HCT: 43.7 % (ref 36.0–46.0)
Hemoglobin: 15.4 g/dL — ABNORMAL HIGH (ref 12.0–15.0)
MCH: 32.3 pg (ref 26.0–34.0)
MCHC: 35.2 g/dL (ref 30.0–36.0)
MCV: 91.6 fL (ref 80.0–100.0)
Platelets: 312 10*3/uL (ref 150–400)
RBC: 4.77 MIL/uL (ref 3.87–5.11)
RDW: 12.7 % (ref 11.5–15.5)
WBC: 9.3 10*3/uL (ref 4.0–10.5)
nRBC: 0 % (ref 0.0–0.2)

## 2022-08-26 LAB — I-STAT BETA HCG BLOOD, ED (MC, WL, AP ONLY): I-stat hCG, quantitative: <5 m[IU]/mL (ref <5)

## 2022-08-26 MED ORDER — NAPROXEN 375 MG PO TABS: 375.0000 mg | ORAL_TABLET | Freq: Two times a day (BID) | ORAL | 0 refills | Status: AC

## 2022-08-26 MED ORDER — METHOCARBAMOL 500 MG PO TABS: 500.0000 mg | ORAL_TABLET | Freq: Two times a day (BID) | ORAL | 0 refills | Status: AC

## 2022-08-26 MED ORDER — KETOROLAC TROMETHAMINE 30 MG/ML IJ SOLN
30.0000 mg | Freq: Once | INTRAMUSCULAR | Status: AC
  Administered 2022-08-26: 30 mg via INTRAVENOUS
  Filled 2022-08-26: qty 1

## 2022-08-26 MED ORDER — KETOROLAC TROMETHAMINE 60 MG/2ML IM SOLN: 60.0000 mg | Freq: Once | INTRAMUSCULAR | Status: DC

## 2022-08-26 MED ORDER — HYDROCODONE-ACETAMINOPHEN 5-325 MG PO TABS
1.0000 | ORAL_TABLET | Freq: Once | ORAL | Status: AC
  Administered 2022-08-26: 1 via ORAL
  Filled 2022-08-26: qty 1

## 2022-08-26 NOTE — ED Notes (Signed)
Patient transported to CT 

## 2022-08-26 NOTE — Discharge Instructions (Addendum)
Take NSAIDs or Tylenol as needed for the next week. Take this medicine with food. Take muscle relaxer at bedtime to help you sleep. This medicine makes you drowsy so do not take before driving or work Use a heating pad for sore muscles - use for 20 minutes several times a day Try gentle range of motion exercises Return for worsening symptoms  
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
## 2022-08-27 ENCOUNTER — Telehealth: Payer: BC Managed Care – PPO | Admitting: Physician Assistant

## 2022-08-27 DIAGNOSIS — M62838 Other muscle spasm: Secondary | ICD-10-CM

## 2022-08-27 DIAGNOSIS — M545 Low back pain, unspecified: Secondary | ICD-10-CM

## 2022-08-27 MED ORDER — LIDOCAINE 4 % EX PTCH: 1.0000 | MEDICATED_PATCH | Freq: Two times a day (BID) | CUTANEOUS | 0 refills | Status: DC | PRN

## 2022-08-27 MED ORDER — METHYLPREDNISOLONE 4 MG PO TBPK: ORAL_TABLET | ORAL | 0 refills | Status: AC

## 2022-08-27 MED ORDER — METHYLPREDNISOLONE 4 MG PO TBPK: ORAL_TABLET | ORAL | 0 refills | Status: DC

## 2022-08-27 MED ORDER — CYCLOBENZAPRINE HCL 10 MG PO TABS: 5.0000 mg | ORAL_TABLET | Freq: Three times a day (TID) | ORAL | 0 refills | Status: AC | PRN

## 2022-08-27 MED ORDER — CYCLOBENZAPRINE HCL 10 MG PO TABS: 5.0000 mg | ORAL_TABLET | Freq: Three times a day (TID) | ORAL | 0 refills | Status: DC | PRN

## 2022-08-27 MED ORDER — LIDOCAINE 4 % EX PTCH: 1.0000 | MEDICATED_PATCH | Freq: Two times a day (BID) | CUTANEOUS | 0 refills | Status: AC | PRN
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
## 2022-08-27 NOTE — Patient Instructions (Signed)
[facility], thank you for joining Mar Daring, PA-C for today's virtual visit.  While this provider is not your primary care provider (PCP), if your PCP is located in our provider database this encounter information will be shared with them immediately following your visit.  Consent: (Patient) Diane James provided verbal consent for this virtual visit at the beginning of the encounter.  Current Medications:  Current Outpatient Medications:    cyclobenzaprine (FLEXERIL) 10 MG tablet, Take 0.5-1 tablets (5-10 mg total) by mouth 3 (three) times daily as needed., Disp: 30 tablet, Rfl: 0   lidocaine 4 %, Place 1 patch onto the skin every 12 (twelve) hours as needed., Disp: 20 patch, Rfl: 0   methylPREDNISolone (MEDROL DOSEPAK) 4 MG TBPK tablet, 6 day taper; take as directed on package instructions, Disp: 21 tablet, Rfl: 0   acetaminophen (TYLENOL) 325 MG tablet, Take by mouth., Disp: , Rfl:    lisdexamfetamine (VYVANSE) 40 MG capsule, Take by mouth., Disp: , Rfl:    methocarbamol (ROBAXIN) 500 MG tablet, Take 1 tablet (500 mg total) by mouth 2 (two) times daily., Disp: 20 tablet, Rfl: 0   naproxen (NAPROSYN) 375 MG tablet, Take 1 tablet (375 mg total) by mouth 2 (two) times daily., Disp: 20 tablet, Rfl: 0   oxyCODONE-acetaminophen (PERCOCET/ROXICET) 5-325 MG tablet, Take 1 tablet by mouth every 6 (six) hours as needed for severe pain., Disp: 6 tablet, Rfl: 0   Medications ordered in this encounter:  Meds ordered this encounter  Medications   methylPREDNISolone (MEDROL DOSEPAK) 4 MG TBPK tablet    Sig: 6 day taper; take as directed on package instructions    Dispense:  21 tablet    Refill:  0    Order Specific Question:   Supervising Provider    Answer:   Chase Picket [[id number]]   cyclobenzaprine (FLEXERIL) 10 MG tablet    Sig: Take 0.5-1 tablets (5-10 mg total) by mouth 3 (three) times daily as needed.    Dispense:  30 tablet    Refill:  0    Order Specific  Question:   Supervising Provider    Answer:   Chase Picket [[id number]]   lidocaine 4 %    Sig: Place 1 patch onto the skin every 12 (twelve) hours as needed.    Dispense:  20 patch    Refill:  0    Order Specific Question:   Supervising Provider    Answer:   Chase Picket [id number]     *If you need refills on other medications prior to your next appointment, please contact your pharmacy*  Follow-Up: Call back or seek an in-person evaluation if the symptoms worsen or if the condition fails to improve as anticipated.  Other Instructions - STOP Robaxin and START Flexeril - May continue Naproxen and Tylenol tonight - TOMORROW start Medrol; HOLD Naproxen/Ibuprofen; Tylenol is okay to continue as needed - Lidocaine patches every 12 hours as needed - Continue heating pad and ice alternating; Start with heat end with ice - Epsom salt soaks - Tomorrow start light stretches, sent via AVS - Follow up in person if pain continues to be uncontrollable or if pain in left hip worsens  Back Exercises The following exercises strengthen the muscles that help to support the trunk (torso) and back. They also help to keep the lower back flexible. Doing these exercises can help to prevent or lessen existing low back pain. If you have back pain or discomfort, try doing  these exercises 2-3 times each day or as told by your health care provider. As your pain improves, do them once each day, but increase the number of times that you repeat the steps for each exercise (do more repetitions). To prevent the recurrence of back pain, continue to do these exercises once each day or as told by your health care provider. Do exercises exactly as told by your health care provider and adjust them as directed. It is normal to feel mild stretching, pulling, tightness, or discomfort as you do these exercises, but you should stop right away if you feel sudden pain or your pain gets worse. Exercises Single knee to  chest Repeat these steps 3-5 times for each leg: Lie on your back on a firm bed or the floor with your legs extended. Bring one knee to your chest. Your other leg should stay extended and in contact with the floor. Hold your knee in place by grabbing your knee or thigh with both hands and hold. Pull on your knee until you feel a gentle stretch in your lower back or buttocks. Hold the stretch for 10-30 seconds. Slowly release and straighten your leg.  Pelvic tilt Repeat these steps 5-10 times: Lie on your back on a firm bed or the floor with your legs extended. Bend your knees so they are pointing toward the ceiling and your feet are flat on the floor. Tighten your lower abdominal muscles to press your lower back against the floor. This motion will tilt your pelvis so your tailbone points up toward the ceiling instead of pointing to your feet or the floor. With gentle tension and even breathing, hold this position for 5-10 seconds.  Cat-cow Repeat these steps until your lower back becomes more flexible: Get into a hands-and-knees position on a firm bed or the floor. Keep your hands under your shoulders, and keep your knees under your hips. You may place padding under your knees for comfort. Let your head hang down toward your chest. Contract your abdominal muscles and point your tailbone toward the floor so your lower back becomes rounded like the back of a cat. Hold this position for 5 seconds. Slowly lift your head, let your abdominal muscles relax, and point your tailbone up toward the ceiling so your back forms a sagging arch like the back of a cow. Hold this position for 5 seconds.  Press-ups Repeat these steps 5-10 times: Lie on your abdomen (face-down) on a firm bed or the floor. Place your palms near your head, about shoulder-width apart. Keeping your back as relaxed as possible and keeping your hips on the floor, slowly straighten your arms to raise the top half of your body and  lift your shoulders. Do not use your back muscles to raise your upper torso. You may adjust the placement of your hands to make yourself more comfortable. Hold this position for 5 seconds while you keep your back relaxed. Slowly return to lying flat on the floor.  Bridges Repeat these steps 10 times: Lie on your back on a firm bed or the floor. Bend your knees so they are pointing toward the ceiling and your feet are flat on the floor. Your arms should be flat at your sides, next to your body. Tighten your buttocks muscles and lift your buttocks off the floor until your waist is at almost the same height as your knees. You should feel the muscles working in your buttocks and the back of your thighs. If you  do not feel these muscles, slide your feet 1-2 inches (2.5-5 cm) farther away from your buttocks. Hold this position for 3-5 seconds. Slowly lower your hips to the starting position, and allow your buttocks muscles to relax completely. If this exercise is too easy, try doing it with your arms crossed over your chest. Abdominal crunches Repeat these steps 5-10 times: Lie on your back on a firm bed or the floor with your legs extended. Bend your knees so they are pointing toward the ceiling and your feet are flat on the floor. Cross your arms over your chest. Tip your chin slightly toward your chest without bending your neck. Tighten your abdominal muscles and slowly raise your torso high enough to lift your shoulder blades a tiny bit off the floor. Avoid raising your torso higher than that because it can put too much stress on your lower back and does not help to strengthen your abdominal muscles. Slowly return to your starting position.  Back lifts Repeat these steps 5-10 times: Lie on your abdomen (face-down) with your arms at your sides, and rest your forehead on the floor. Tighten the muscles in your legs and your buttocks. Slowly lift your chest off the floor while you keep your hips  pressed to the floor. Keep the back of your head in line with the curve in your back. Your eyes should be looking at the floor. Hold this position for 3-5 seconds. Slowly return to your starting position.  Contact a health care provider if: Your back pain or discomfort gets much worse when you do an exercise. Your worsening back pain or discomfort does not lessen within 2 hours after you exercise. If you have any of these problems, stop doing these exercises right away. Do not do them again unless your health care provider says that you can. Get help right away if: You develop sudden, severe back pain. If this happens, stop doing the exercises right away. Do not do them again unless your health care provider says that you can. This information is not intended to replace advice given to you by your health care provider. Make sure you discuss any questions you have with your health care provider. Document Revised: 05/25/2021 Document Reviewed: 02/10/2021 Elsevier Patient Education  2023 [employer]    If you have been instructed to have an in-person evaluation today at a local Urgent Care facility, please use the link below. It will take you to a list of all of our available [facility], including address, phone number and hours of operation. Please do not delay care.  [facility]  If you or a family member do not have a primary care provider, use the link below to schedule a visit and establish care. When you choose a [facility] primary care physician or advanced practice provider, you gain a long-term partner in health. Find a Primary Care Provider  Learn more about [facility]'s in-office and virtual care options: [facility] - Get Care Now

## 2022-08-27 NOTE — Progress Notes (Signed)
Needed meds changed to a different pharmacy that was open. Meds moved to [facility]

## 2022-08-29 ENCOUNTER — Ambulatory Visit
Admission: EM | Admit: 2022-08-29 | Discharge: 2022-08-29 | Disposition: A | Discharge status: Home / Self Care | Payer: BC Managed Care – PPO | Attending: Physician Assistant | Admitting: Physician Assistant

## 2022-08-29 ENCOUNTER — Encounter: Payer: Self-pay | Admitting: Emergency Medicine

## 2022-08-29 DIAGNOSIS — M545 Low back pain, unspecified: Secondary | ICD-10-CM

## 2022-08-29 DIAGNOSIS — R519 Headache, unspecified: Secondary | ICD-10-CM | POA: Diagnosis not present

## 2022-08-29 MED ORDER — HYDROCODONE-ACETAMINOPHEN 5-325 MG PO TABS: 1.0000 | ORAL_TABLET | Freq: Four times a day (QID) | ORAL | 0 refills | Status: AC | PRN
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
## 2022-08-29 NOTE — Discharge Instructions (Addendum)
-  Continue treatment as prescribed by providers at the ER and only virtual care visit.  We will also continue with the light stretches. -For pain that continues despite this treatment, can take a 1 Norco tablet every 6 hours as needed. -This medication does contain acetaminophen so while taking both this medication and Tylenol make sure that you are not getting more than 4 g total of acetaminophen -Follow-up with primary care provider as needed if symptoms worsen or not improve -Will most likely take several days to a week or so for your symptoms to fully improve.

## 2022-09-20 DIAGNOSIS — F900 Attention-deficit hyperactivity disorder, predominantly inattentive type: Secondary | ICD-10-CM | POA: Diagnosis not present

## 2022-09-20 DIAGNOSIS — F4310 Post-traumatic stress disorder, unspecified: Secondary | ICD-10-CM | POA: Diagnosis not present

## 2022-09-20 DIAGNOSIS — F419 Anxiety disorder, unspecified: Secondary | ICD-10-CM | POA: Diagnosis not present

## 2022-09-28 DIAGNOSIS — F4310 Post-traumatic stress disorder, unspecified: Secondary | ICD-10-CM | POA: Diagnosis not present

## 2022-09-28 DIAGNOSIS — F39 Unspecified mood [affective] disorder: Secondary | ICD-10-CM | POA: Diagnosis not present

## 2022-10-05 DIAGNOSIS — F39 Unspecified mood [affective] disorder: Secondary | ICD-10-CM | POA: Diagnosis not present

## 2022-10-05 DIAGNOSIS — F4310 Post-traumatic stress disorder, unspecified: Secondary | ICD-10-CM | POA: Diagnosis not present

## 2022-10-12 DIAGNOSIS — F4310 Post-traumatic stress disorder, unspecified: Secondary | ICD-10-CM | POA: Diagnosis not present

## 2022-10-12 DIAGNOSIS — F39 Unspecified mood [affective] disorder: Secondary | ICD-10-CM | POA: Diagnosis not present

## 2022-10-18 DIAGNOSIS — F900 Attention-deficit hyperactivity disorder, predominantly inattentive type: Secondary | ICD-10-CM | POA: Diagnosis not present

## 2022-10-18 DIAGNOSIS — F39 Unspecified mood [affective] disorder: Secondary | ICD-10-CM | POA: Diagnosis not present

## 2022-10-21 DIAGNOSIS — F39 Unspecified mood [affective] disorder: Secondary | ICD-10-CM | POA: Diagnosis not present

## 2022-10-21 DIAGNOSIS — F4310 Post-traumatic stress disorder, unspecified: Secondary | ICD-10-CM | POA: Diagnosis not present

## 2022-11-15 DIAGNOSIS — F900 Attention-deficit hyperactivity disorder, predominantly inattentive type: Secondary | ICD-10-CM | POA: Diagnosis not present

## 2022-11-16 DIAGNOSIS — F39 Unspecified mood [affective] disorder: Secondary | ICD-10-CM | POA: Diagnosis not present

## 2022-11-16 DIAGNOSIS — F4310 Post-traumatic stress disorder, unspecified: Secondary | ICD-10-CM | POA: Diagnosis not present

## 2022-11-23 DIAGNOSIS — F4310 Post-traumatic stress disorder, unspecified: Secondary | ICD-10-CM | POA: Diagnosis not present

## 2022-11-23 DIAGNOSIS — F39 Unspecified mood [affective] disorder: Secondary | ICD-10-CM | POA: Diagnosis not present

## 2022-12-13 DIAGNOSIS — F900 Attention-deficit hyperactivity disorder, predominantly inattentive type: Secondary | ICD-10-CM | POA: Diagnosis not present

## 2022-12-29 DIAGNOSIS — F988 Other specified behavioral and emotional disorders with onset usually occurring in childhood and adolescence: Secondary | ICD-10-CM | POA: Diagnosis not present

## 2022-12-29 DIAGNOSIS — F39 Unspecified mood [affective] disorder: Secondary | ICD-10-CM | POA: Diagnosis not present

## 2022-12-29 DIAGNOSIS — F4310 Post-traumatic stress disorder, unspecified: Secondary | ICD-10-CM | POA: Diagnosis not present

## 2023-01-06 DIAGNOSIS — F4310 Post-traumatic stress disorder, unspecified: Secondary | ICD-10-CM | POA: Diagnosis not present

## 2023-01-06 DIAGNOSIS — F39 Unspecified mood [affective] disorder: Secondary | ICD-10-CM | POA: Diagnosis not present

## 2023-01-06 DIAGNOSIS — F988 Other specified behavioral and emotional disorders with onset usually occurring in childhood and adolescence: Secondary | ICD-10-CM | POA: Diagnosis not present

## 2023-01-18 DIAGNOSIS — F988 Other specified behavioral and emotional disorders with onset usually occurring in childhood and adolescence: Secondary | ICD-10-CM | POA: Diagnosis not present

## 2023-01-18 DIAGNOSIS — F39 Unspecified mood [affective] disorder: Secondary | ICD-10-CM | POA: Diagnosis not present

## 2023-01-18 DIAGNOSIS — F4310 Post-traumatic stress disorder, unspecified: Secondary | ICD-10-CM | POA: Diagnosis not present

## 2023-02-01 DIAGNOSIS — F4310 Post-traumatic stress disorder, unspecified: Secondary | ICD-10-CM | POA: Diagnosis not present

## 2023-02-01 DIAGNOSIS — F988 Other specified behavioral and emotional disorders with onset usually occurring in childhood and adolescence: Secondary | ICD-10-CM | POA: Diagnosis not present

## 2023-02-01 DIAGNOSIS — F39 Unspecified mood [affective] disorder: Secondary | ICD-10-CM | POA: Diagnosis not present

## 2023-02-07 DIAGNOSIS — F900 Attention-deficit hyperactivity disorder, predominantly inattentive type: Secondary | ICD-10-CM | POA: Diagnosis not present

## 2023-02-15 DIAGNOSIS — F39 Unspecified mood [affective] disorder: Secondary | ICD-10-CM | POA: Diagnosis not present

## 2023-02-15 DIAGNOSIS — F988 Other specified behavioral and emotional disorders with onset usually occurring in childhood and adolescence: Secondary | ICD-10-CM | POA: Diagnosis not present

## 2023-02-15 DIAGNOSIS — F4310 Post-traumatic stress disorder, unspecified: Secondary | ICD-10-CM | POA: Diagnosis not present

## 2023-02-22 DIAGNOSIS — F39 Unspecified mood [affective] disorder: Secondary | ICD-10-CM | POA: Diagnosis not present

## 2023-02-22 DIAGNOSIS — F4310 Post-traumatic stress disorder, unspecified: Secondary | ICD-10-CM | POA: Diagnosis not present

## 2023-02-22 DIAGNOSIS — F988 Other specified behavioral and emotional disorders with onset usually occurring in childhood and adolescence: Secondary | ICD-10-CM | POA: Diagnosis not present

## 2023-03-07 DIAGNOSIS — F39 Unspecified mood [affective] disorder: Secondary | ICD-10-CM | POA: Diagnosis not present

## 2023-03-07 DIAGNOSIS — F900 Attention-deficit hyperactivity disorder, predominantly inattentive type: Secondary | ICD-10-CM | POA: Diagnosis not present

## 2023-03-08 DIAGNOSIS — F988 Other specified behavioral and emotional disorders with onset usually occurring in childhood and adolescence: Secondary | ICD-10-CM | POA: Diagnosis not present

## 2023-03-08 DIAGNOSIS — F4310 Post-traumatic stress disorder, unspecified: Secondary | ICD-10-CM | POA: Diagnosis not present

## 2023-03-08 DIAGNOSIS — F39 Unspecified mood [affective] disorder: Secondary | ICD-10-CM | POA: Diagnosis not present
